# Patient Record
Sex: Male | Born: 1940 | Race: White | Hispanic: No | Marital: Married | Smoking: Former smoker
Health system: Southern US, Community
[De-identification: ages and names within clinical notes are randomized; demographics above are authoritative.]

## PROBLEM LIST (undated history)

## (undated) DIAGNOSIS — G459 Transient cerebral ischemic attack, unspecified: Secondary | ICD-10-CM

## (undated) DIAGNOSIS — H9319 Tinnitus, unspecified ear: Secondary | ICD-10-CM

## (undated) DIAGNOSIS — Z9889 Other specified postprocedural states: Secondary | ICD-10-CM

## (undated) DIAGNOSIS — R42 Dizziness and giddiness: Secondary | ICD-10-CM

## (undated) DIAGNOSIS — R112 Nausea with vomiting, unspecified: Secondary | ICD-10-CM

## (undated) HISTORY — DX: Dizziness and giddiness: R42

## (undated) HISTORY — DX: Transient cerebral ischemic attack, unspecified: G45.9

## (undated) HISTORY — DX: Tinnitus, unspecified ear: H93.19

---

## 1977-09-30 HISTORY — PX: KNEE SURGERY: SHX244

## 1997-09-30 HISTORY — PX: BACK SURGERY: SHX140

## 2001-06-18 ENCOUNTER — Encounter: Admission: RE | Admit: 2001-06-18 | Discharge: 2001-06-18 | Payer: Self-pay | Admitting: Family Medicine

## 2001-06-18 ENCOUNTER — Encounter: Payer: Self-pay | Admitting: Family Medicine

## 2003-05-27 ENCOUNTER — Encounter: Payer: Self-pay | Admitting: Family Medicine

## 2003-05-27 ENCOUNTER — Encounter: Admission: RE | Admit: 2003-05-27 | Discharge: 2003-05-27 | Payer: Self-pay | Admitting: Family Medicine

## 2004-11-30 ENCOUNTER — Ambulatory Visit: Payer: Self-pay | Admitting: Family Medicine

## 2004-12-10 ENCOUNTER — Ambulatory Visit: Payer: Self-pay | Admitting: Family Medicine

## 2004-12-19 ENCOUNTER — Ambulatory Visit: Payer: Self-pay | Admitting: Gastroenterology

## 2005-01-02 ENCOUNTER — Ambulatory Visit: Payer: Self-pay | Admitting: Gastroenterology

## 2006-01-13 ENCOUNTER — Ambulatory Visit: Payer: Self-pay | Admitting: Family Medicine

## 2006-02-04 ENCOUNTER — Ambulatory Visit: Payer: Self-pay

## 2007-04-16 DIAGNOSIS — J309 Allergic rhinitis, unspecified: Secondary | ICD-10-CM | POA: Insufficient documentation

## 2009-09-01 ENCOUNTER — Encounter: Admission: RE | Admit: 2009-09-01 | Discharge: 2009-09-01 | Payer: Self-pay | Admitting: Family Medicine

## 2009-09-30 HISTORY — PX: OTHER SURGICAL HISTORY: SHX169

## 2012-07-01 ENCOUNTER — Other Ambulatory Visit: Payer: Self-pay | Admitting: Urology

## 2012-07-22 ENCOUNTER — Encounter (HOSPITAL_COMMUNITY): Payer: Self-pay | Admitting: Pharmacy Technician

## 2012-07-27 NOTE — Patient Instructions (Signed)
Brandon Stein  07/27/2012   Your procedure is scheduled on:  07/31/12   Report to [facility] at 0900am-1100am  Call this number if you have problems the morning of surgery: [phone]   Remember:   Do not eat food:After Midnight.  May have clear liquids:until Midnight .    Take these medicines the morning of surgery with A SIP OF WATER:    Do not wear jewelry,   Do not wear lotions, powders, or perfume  . Men may shave face and neck.  Do not bring valuables to the hospital.  Contacts, dentures or bridgework may not be worn into surgery.  Leave suitcase in the car. After surgery it may be brought to your room.  For patients admitted to the hospital, checkout time is 11:00 AM the day of discharge.             SEE CHG INSTRUCTION SHEET    Please read over the following fact sheets that you were given: MRSA Information, Coughing and deep breathing exericses, leg exercises

## 2012-07-28 ENCOUNTER — Encounter (HOSPITAL_COMMUNITY): Payer: Self-pay

## 2012-07-28 ENCOUNTER — Encounter (HOSPITAL_COMMUNITY)
Admission: RE | Admit: 2012-07-28 | Discharge: 2012-07-28 | Disposition: A | Discharge status: Home / Self Care | Payer: Medicare HMO | Source: Ambulatory Visit | Attending: Urology | Admitting: Urology

## 2012-07-28 HISTORY — DX: Other specified postprocedural states: R11.2

## 2012-07-28 HISTORY — DX: Other specified postprocedural states: Z98.890

## 2012-07-28 LAB — BASIC METABOLIC PANEL
BUN: 15 mg/dL (ref 6–23)
CO2: 29 mEq/L (ref 19–32)
Calcium: 10.1 mg/dL (ref 8.4–10.5)
Chloride: 104 mEq/L (ref 96–112)
Creatinine, Ser: 0.96 mg/dL (ref 0.50–1.35)
Glucose, Bld: 55 mg/dL — ABNORMAL LOW (ref 70–99)

## 2012-07-28 LAB — CBC
HCT: 47.1 % (ref 39.0–52.0)
MCH: 30.1 pg (ref 26.0–34.0)
MCHC: 36.1 g/dL — ABNORMAL HIGH (ref 30.0–36.0)
MCV: 83.5 fL (ref 78.0–100.0)
Platelets: 200 10*3/uL (ref 150–400)
RDW: 13.6 % (ref 11.5–15.5)

## 2012-07-28 NOTE — Progress Notes (Signed)
Glucose on preop labs this am was 55. Patient voiced no complaints at preop appointment.  Left patient a message on cell phone to give nurse a call and verify that he remains okay.  Patient is not diabetic.

## 2012-07-30 MED ORDER — PROMETHAZINE HCL 25 MG/ML IJ SOLN
INTRAMUSCULAR | Status: AC
  Filled 2012-07-30: qty 1

## 2012-07-31 ENCOUNTER — Encounter (HOSPITAL_COMMUNITY): Payer: Self-pay | Admitting: *Deleted

## 2012-07-31 ENCOUNTER — Encounter (HOSPITAL_COMMUNITY): Payer: Self-pay | Admitting: Anesthesiology

## 2012-07-31 ENCOUNTER — Ambulatory Visit (HOSPITAL_COMMUNITY): Payer: Medicare HMO | Admitting: Anesthesiology

## 2012-07-31 ENCOUNTER — Observation Stay (HOSPITAL_COMMUNITY)
Admission: RE | Admit: 2012-07-31 | Discharge: 2012-08-01 | Disposition: A | Discharge status: Home / Self Care | Payer: Medicare HMO | Source: Ambulatory Visit | Attending: Urology | Admitting: Urology

## 2012-07-31 ENCOUNTER — Encounter (HOSPITAL_COMMUNITY)
Admission: RE | Disposition: A | Discharge status: Home / Self Care | Payer: Self-pay | Source: Ambulatory Visit | Attending: Urology

## 2012-07-31 DIAGNOSIS — N401 Enlarged prostate with lower urinary tract symptoms: Secondary | ICD-10-CM | POA: Insufficient documentation

## 2012-07-31 DIAGNOSIS — R39198 Other difficulties with micturition: Secondary | ICD-10-CM | POA: Insufficient documentation

## 2012-07-31 DIAGNOSIS — N138 Other obstructive and reflux uropathy: Principal | ICD-10-CM | POA: Insufficient documentation

## 2012-07-31 DIAGNOSIS — Z01812 Encounter for preprocedural laboratory examination: Secondary | ICD-10-CM | POA: Insufficient documentation

## 2012-07-31 DIAGNOSIS — N400 Enlarged prostate without lower urinary tract symptoms: Secondary | ICD-10-CM

## 2012-07-31 DIAGNOSIS — R339 Retention of urine, unspecified: Secondary | ICD-10-CM | POA: Insufficient documentation

## 2012-07-31 HISTORY — PX: TRANSURETHRAL RESECTION OF PROSTATE: SHX73

## 2012-07-31 SURGERY — TRANSURETHRAL RESECTION OF THE PROSTATE WITH GYRUS INSTRUMENTS
Anesthesia: General | Site: Bladder | Wound class: Clean Contaminated

## 2012-07-31 MED ORDER — MEPERIDINE HCL 50 MG/ML IJ SOLN: 6.2500 mg | INTRAMUSCULAR | Status: DC | PRN

## 2012-07-31 MED ORDER — BELLADONNA ALKALOIDS-OPIUM 16.2-60 MG RE SUPP
RECTAL | Status: AC
  Filled 2012-07-31: qty 1

## 2012-07-31 MED ORDER — BELLADONNA ALKALOIDS-OPIUM 16.2-60 MG RE SUPP
RECTAL | Status: DC | PRN
  Administered 2012-07-31: 1 via RECTAL

## 2012-07-31 MED ORDER — FENTANYL CITRATE 0.05 MG/ML IJ SOLN
INTRAMUSCULAR | Status: DC | PRN
  Administered 2012-07-31: 25 ug via INTRAVENOUS
  Administered 2012-07-31: 50 ug via INTRAVENOUS
  Administered 2012-07-31: 25 ug via INTRAVENOUS
  Administered 2012-07-31: 50 ug via INTRAVENOUS
  Administered 2012-07-31: 25 ug via INTRAVENOUS

## 2012-07-31 MED ORDER — HYOSCYAMINE SULFATE 0.125 MG SL SUBL
0.1250 mg | SUBLINGUAL_TABLET | SUBLINGUAL | Status: DC | PRN
  Filled 2012-07-31: qty 1

## 2012-07-31 MED ORDER — INDIGOTINDISULFONATE SODIUM 8 MG/ML IJ SOLN
INTRAMUSCULAR | Status: AC
  Filled 2012-07-31: qty 5

## 2012-07-31 MED ORDER — BACITRACIN-NEOMYCIN-POLYMYXIN 400-5-5000 EX OINT: 1.0000 "application " | TOPICAL_OINTMENT | Freq: Three times a day (TID) | CUTANEOUS | Status: DC | PRN

## 2012-07-31 MED ORDER — LACTATED RINGERS IV SOLN
INTRAVENOUS | Status: DC
  Administered 2012-08-01: 05:00:00 via INTRAVENOUS

## 2012-07-31 MED ORDER — PROPOFOL 10 MG/ML IV BOLUS
INTRAVENOUS | Status: DC | PRN
  Administered 2012-07-31: 200 mg via INTRAVENOUS

## 2012-07-31 MED ORDER — CEFAZOLIN SODIUM 1-5 GM-% IV SOLN: 1.0000 g | INTRAVENOUS | Status: DC

## 2012-07-31 MED ORDER — MORPHINE SULFATE 2 MG/ML IJ SOLN: 2.0000 mg | INTRAMUSCULAR | Status: DC | PRN

## 2012-07-31 MED ORDER — ACETAMINOPHEN 325 MG PO TABS: 650.0000 mg | ORAL_TABLET | ORAL | Status: DC | PRN

## 2012-07-31 MED ORDER — ACETAMINOPHEN 10 MG/ML IV SOLN
INTRAVENOUS | Status: DC | PRN
  Administered 2012-07-31: 1000 mg via INTRAVENOUS

## 2012-07-31 MED ORDER — PROMETHAZINE HCL 25 MG/ML IJ SOLN
INTRAMUSCULAR | Status: AC
  Filled 2012-07-31: qty 1

## 2012-07-31 MED ORDER — SULFAMETHOXAZOLE-TMP DS 800-160 MG PO TABS
1.0000 | ORAL_TABLET | Freq: Two times a day (BID) | ORAL | Status: DC
  Administered 2012-08-01: 1 via ORAL
  Filled 2012-07-31 (×2): qty 1

## 2012-07-31 MED ORDER — LEVOFLOXACIN IN D5W 500 MG/100ML IV SOLN
500.0000 mg | INTRAVENOUS | Status: AC
  Administered 2012-07-31: 500 mg via INTRAVENOUS
  Filled 2012-07-31: qty 100

## 2012-07-31 MED ORDER — EPHEDRINE SULFATE 50 MG/ML IJ SOLN
INTRAMUSCULAR | Status: DC | PRN
  Administered 2012-07-31: 5 mg via INTRAVENOUS

## 2012-07-31 MED ORDER — DOCUSATE SODIUM 100 MG PO CAPS: 100.0000 mg | ORAL_CAPSULE | Freq: Two times a day (BID) | ORAL | Status: DC

## 2012-07-31 MED ORDER — DEXAMETHASONE SODIUM PHOSPHATE 10 MG/ML IJ SOLN
INTRAMUSCULAR | Status: DC | PRN
  Administered 2012-07-31: 10 mg via INTRAVENOUS

## 2012-07-31 MED ORDER — OXYCODONE-ACETAMINOPHEN 5-325 MG PO TABS: 1.0000 | ORAL_TABLET | ORAL | Status: DC | PRN

## 2012-07-31 MED ORDER — TAMSULOSIN HCL 0.4 MG PO CAPS: 0.4000 mg | ORAL_CAPSULE | Freq: Every day | ORAL | Status: DC

## 2012-07-31 MED ORDER — SODIUM CHLORIDE 0.9 % IR SOLN
Status: DC | PRN
  Administered 2012-07-31: 30000 mL via INTRAVESICAL

## 2012-07-31 MED ORDER — FENTANYL CITRATE 0.05 MG/ML IJ SOLN: 25.0000 ug | INTRAMUSCULAR | Status: DC | PRN

## 2012-07-31 MED ORDER — LIDOCAINE HCL 2 % EX GEL
CUTANEOUS | Status: AC
  Filled 2012-07-31: qty 10

## 2012-07-31 MED ORDER — ACETAMINOPHEN 10 MG/ML IV SOLN
INTRAVENOUS | Status: AC
  Filled 2012-07-31: qty 100

## 2012-07-31 MED ORDER — LACTATED RINGERS IV SOLN: INTRAVENOUS | Status: DC

## 2012-07-31 MED ORDER — SULFAMETHOXAZOLE-TRIMETHOPRIM 800-160 MG PO TABS: 1.0000 | ORAL_TABLET | Freq: Two times a day (BID) | ORAL | Status: DC

## 2012-07-31 MED ORDER — ONDANSETRON HCL 4 MG/2ML IJ SOLN: 4.0000 mg | INTRAMUSCULAR | Status: DC | PRN

## 2012-07-31 MED ORDER — ZOLPIDEM TARTRATE 5 MG PO TABS: 5.0000 mg | ORAL_TABLET | Freq: Every evening | ORAL | Status: DC | PRN

## 2012-07-31 MED ORDER — SODIUM CHLORIDE 0.9 % IR SOLN
3000.0000 mL | Status: DC
  Administered 2012-07-31: 3000 mL

## 2012-07-31 MED ORDER — ONDANSETRON HCL 4 MG/2ML IJ SOLN
INTRAMUSCULAR | Status: DC | PRN
  Administered 2012-07-31: 4 mg via INTRAVENOUS

## 2012-07-31 MED ORDER — LIDOCAINE HCL (CARDIAC) 20 MG/ML IV SOLN
INTRAVENOUS | Status: DC | PRN
  Administered 2012-07-31: 50 mg via INTRAVENOUS

## 2012-07-31 MED ORDER — LACTATED RINGERS IV SOLN
INTRAVENOUS | Status: DC | PRN
  Administered 2012-07-31 (×2): via INTRAVENOUS

## 2012-07-31 MED ORDER — PROMETHAZINE HCL 25 MG/ML IJ SOLN
6.2500 mg | INTRAMUSCULAR | Status: DC | PRN
  Administered 2012-07-31: 6.25 mg via INTRAVENOUS

## 2012-07-31 MED ORDER — DOCUSATE SODIUM 100 MG PO CAPS
100.0000 mg | ORAL_CAPSULE | Freq: Two times a day (BID) | ORAL | Status: DC
  Administered 2012-07-31 – 2012-08-01 (×2): 100 mg via ORAL
  Filled 2012-07-31 (×3): qty 1

## 2012-07-31 MED ORDER — MIDAZOLAM HCL 5 MG/5ML IJ SOLN
INTRAMUSCULAR | Status: DC | PRN
  Administered 2012-07-31: 2 mg via INTRAVENOUS

## 2012-07-31 SURGICAL SUPPLY — 17 items
BAG URINE DRAINAGE (UROLOGICAL SUPPLIES) ×2 IMPLANT
BAG URO CATCHER STRL LF (DRAPE) ×2 IMPLANT
CATH FOLEY 3WAY 30CC 22FR (CATHETERS) ×2 IMPLANT
CLOTH BEACON ORANGE TIMEOUT ST (SAFETY) ×2 IMPLANT
DRAPE CAMERA CLOSED 9X96 (DRAPES) ×2 IMPLANT
ELECT BUTTON HF 24-28F 2 30DE (ELECTRODE) ×2 IMPLANT
ELECT LOOP MED HF 24F 12D (CUTTING LOOP) ×1 IMPLANT
ELECT LOOP MED HF 24F 12D CBL (CLIP) ×2 IMPLANT
ELECT RESECT VAPORIZE 12D CBL (ELECTRODE) IMPLANT
EVACUATOR MICROVAS BLADDER (UROLOGICAL SUPPLIES) ×2 IMPLANT
GLOVE BIOGEL M STRL SZ7.5 (GLOVE) ×2 IMPLANT
GOWN STRL REIN XL XLG (GOWN DISPOSABLE) ×2 IMPLANT
HOLDER FOLEY CATH W/STRAP (MISCELLANEOUS) ×1 IMPLANT
MANIFOLD NEPTUNE II (INSTRUMENTS) ×2 IMPLANT
PACK CYSTO (CUSTOM PROCEDURE TRAY) ×2 IMPLANT
SYR 30ML LL (SYRINGE) ×1 IMPLANT
TUBING CONNECTING 10 (TUBING) ×2 IMPLANT

## 2012-07-31 NOTE — H&P (Signed)
H&P  History of Present Illness:  71 yo with long history of BPH, weak stream, prostatitis and incomplete bladder emptying. He does not want to take medication for BPH and has tried alpha blockers and 5alphaRI in the past but did not like the side effects and stopped them. He presents today for TURP.    Past Medical History  Diagnosis Date  . PONV (postoperative nausea and vomiting)    Past Surgical History  Procedure Date  . Right shoulder surgery    . Back surgery     L4-5   . Knee surgery     bilateral     Home Medications:  No prescriptions prior to admission   Allergies:  Allergies  Allergen Reactions  . Cefaclor     REACTION: Hives    History reviewed. No pertinent family history. Social History:  reports that he has never smoked. He has never used smokeless tobacco. He reports that he drinks alcohol. He reports that he does not use illicit drugs.  ROS: A complete review of systems was performed.  All systems are negative except for pertinent findings as noted. @ROS @   Physical Exam:  Vital signs in last 24 hours: Temp:  [98.5 F (36.9 C)] 98.5 F (36.9 C) (11/01 0714) Pulse Rate:  [60] 60  (11/01 0714) Resp:  [18] 18  (11/01 0714) BP: (130)/(79) 130/79 mmHg (11/01 0714) SpO2:  [96 %] 96 % (11/01 0714) General:  Alert and oriented, No acute distress HEENT: Normocephalic, atraumatic Cardiovascular: Regular rate and rhythm Lungs: Regular rate and effort Abdomen: Soft, nontender, nondistended, no abdominal masses Back: No CVA tenderness Extremities: No edema Neurologic: Grossly intact  Laboratory Data:  No results found for this or any previous visit (from the past 24 hour(s)). Recent Results (from the past 240 hour(s))  SURGICAL PCR SCREEN     Status: Normal   Collection Time   07/28/12  9:25 AM      Component Value Range Status Comment   MRSA, PCR NEGATIVE  NEGATIVE Final    Staphylococcus aureus NEGATIVE  NEGATIVE Final    Creatinine:  Basename  07/28/12 0825  CREATININE 0.96    Impression/Assessment:  BPH, incomplete bladder emptying  Plan:  I discussed with the patient the nature, potential benefits, risks and alternatives to bipolar TURP, including side effects of the proposed treatment, the likelihood of the patient achieving the goals of the procedure, and any potential problems that might occur during the procedure or recuperation. All questions answered. Patient elects to proceed.   Antony Haste 07/31/2012, 8:58 AM

## 2012-07-31 NOTE — Preoperative (Signed)
Beta Blockers   Reason not to administer Beta Blockers:Not Applicable 

## 2012-07-31 NOTE — Transfer of Care (Signed)
Immediate Anesthesia Transfer of Care Note  Patient: Brandon Stein  Procedure(s) Performed: Procedure(s) (LRB) with comments: TRANSURETHRAL RESECTION OF THE PROSTATE WITH GYRUS INSTRUMENTS (N/A)  Patient Location: PACU  Anesthesia Type:General  Level of Consciousness: awake, alert  and oriented  Airway & Oxygen Therapy: Patient Spontanous Breathing and Patient connected to face mask oxygen  Post-op Assessment: Report given to PACU RN, Post -op Vital signs reviewed and stable and Patient moving all extremities X 4  Post vital signs: Reviewed and stable  Complications: No apparent anesthesia complications

## 2012-07-31 NOTE — Op Note (Signed)
Preop diagnosis: BPH, incomplete bladder emptying, weak stream Postoperative diagnosis same.  Procedure transurethral resection of prostate  Surgeon Eschol Auxier  Anesthesia Gen.  Findings: Cystoscopy-the urethra was normal prostatic urethra was obstructed by a bilobed large median lobe protruding into the bladder. There were also short obstructing lateral lobes. The trigone and ureteral orifice these appeared normal and in their orthotopic position. They were examined pre-and post resection noted to be normal. The bladder was moderately trabeculated. There was no foreign body lesion or tumor.  Description of procedure: After consent was obtained patient brought to the operating room. A timeout was performed to confirm the patient and procedure. After adequate anesthesia he was placed in lithotomy position and prepped and draped in usual fashion. The cystoscope was passed per urethra and the bladder examined. The resectoscope sheath was then passed followed by insertion of the resectoscope handle. Using the loop the median lobe was taken down and natural separation in the midline down to the bladder neck and down to the area. Then toward the patient's left side another incision was made into the bladder neck down to the capsule extending a standard inferior to separate the left side of a large median lobe. The intervening median lobe was then resected down from bladder neck to veru. The prostatic fossa remained obstructed by the remainder the median lobe going over toward the patient's right side another incision was made down through the adenoma down to the bladder neck and this was carried down to the veru developing the remaining adenoma the median lobe. In a similar fashion this part of the lobe was resected down to the bladder neck in that area completing the median lobe resection. Now what appeared to be short and nonobstructing lateral lobes collapsed inward and instructed the prostatic urethra they  were resected from anterior down the posterior and connecting these to the median lobe resection started on the left side and then the right side. No resection was carried distal to the veru. All the chips were evacuated with a Microvasive in the button was placed. Some residual tissue laterally and medially was vaporized and adequate hemostasis was obtained. The ureteral orifice these were inspected again and noted to be normal also no chips were seen visually in the bladder. Irrigated with a Microvasive one more time and no chips were seen. The scope was removed and a 22 [place] three-way Foley catheter was placed with catheter guide. The balloon was inflated to 30 cc and seated at the bladder neck. The patient was awakened and taken to recovery in stable condition.  Estimated blood loss: Minimal  Specimen: TURP chips  Drains: 22 French three-way Foley  Complications: None  Disposition: Patient stable to PACU

## 2012-07-31 NOTE — Anesthesia Postprocedure Evaluation (Signed)
  Anesthesia Post-op Note  Patient: Brandon Stein  Procedure(s) Performed: Procedure(s) (LRB): TRANSURETHRAL RESECTION OF THE PROSTATE WITH GYRUS INSTRUMENTS (N/A)  Patient Location: PACU  Anesthesia Type: General  Level of Consciousness: awake and alert   Airway and Oxygen Therapy: Patient Spontanous Breathing  Post-op Pain: mild  Post-op Assessment: Post-op Vital signs reviewed, Patient's Cardiovascular Status Stable, Respiratory Function Stable, Patent Airway and No signs of Nausea or vomiting  Post-op Vital Signs: stable  Complications: No apparent anesthesia complications

## 2012-07-31 NOTE — Anesthesia Preprocedure Evaluation (Addendum)
Anesthesia Evaluation  Patient identified by MRN, date of birth, ID band Patient awake    Reviewed: Allergy & Precautions, H&P , NPO status , Patient's Chart, lab work & pertinent test results  History of Anesthesia Complications (+) PONV  Airway Mallampati: II TM Distance: >3 FB Neck ROM: Full    Dental No notable dental hx.    Pulmonary neg pulmonary ROS,  breath sounds clear to auscultation  Pulmonary exam normal       Cardiovascular negative cardio ROS  Rhythm:Regular Rate:Normal     Neuro/Psych negative neurological ROS  negative psych ROS   GI/Hepatic negative GI ROS, Neg liver ROS,   Endo/Other  negative endocrine ROS  Renal/GU negative Renal ROS  negative genitourinary   Musculoskeletal negative musculoskeletal ROS (+)   Abdominal   Peds negative pediatric ROS (+)  Hematology negative hematology ROS (+)   Anesthesia Other Findings   Reproductive/Obstetrics negative OB ROS                           Anesthesia Physical Anesthesia Plan  ASA: II  Anesthesia Plan: General   Post-op Pain Management:    Induction: Intravenous  Airway Management Planned: LMA and Oral ETT  Additional Equipment:   Intra-op Plan:   Post-operative Plan: Extubation in OR  Informed Consent: I have reviewed the patients History and Physical, chart, labs and discussed the procedure including the risks, benefits and alternatives for the proposed anesthesia with the patient or authorized representative who has indicated his/her understanding and acceptance.   Dental advisory given  Plan Discussed with: CRNA  Anesthesia Plan Comments:         Anesthesia Quick Evaluation

## 2012-08-01 NOTE — Progress Notes (Signed)
Urology Progress Note  Subjective:     No acute urologic events overnight. Foley remove this morning; voiding without difficulty.   ROS: Negative: Chest pain, SOB.  Objective:  Patient Vitals for the past 24 hrs:  BP Temp Temp src Pulse Resp SpO2 Height Weight  08/01/12 0521 127/72 mmHg 97.4 F (36.3 C) Oral 60  18  97 % - -  08/01/12 0206 120/62 mmHg 97.5 F (36.4 C) Oral 70  18  98 % - -  07/31/12 1956 127/72 mmHg 98 F (36.7 C) Oral 63  18  97 % - -  07/31/12 1728 137/80 mmHg 97.4 F (36.3 C) Oral 59  18  97 % - -  07/31/12 1534 127/74 mmHg 97.7 F (36.5 C) Oral 85  18  100 % - -  07/31/12 1216 - - - - - - 5\' 9"  (1.753 m) 83.12 kg (183 lb 3.9 oz)  07/31/12 1200 142/82 mmHg 97.5 F (36.4 C) - 53  - 100 % - -  07/31/12 1145 132/72 mmHg - - 47  12  100 % - -  07/31/12 1130 134/65 mmHg 97.5 F (36.4 C) - 53  19  100 % - -  07/31/12 1115 135/76 mmHg - - 55  16  100 % - -  07/31/12 1100 134/92 mmHg 97.5 F (36.4 C) - 62  12  100 % - -    Physical Exam: General:  No acute distress, awake Cardiovascular:    [x]   S1/S2 present, RRR  []   Irregularly irregular Chest:  CTA-B Abdomen:               []  Soft, appropriately TTP  [x]  Soft, NTTP  []  Soft, appropriately TTP, incision(s) clean/dry/intact  Genitourinary: No edema. Foley:  None. 1st voided specimen is cherry koolaid colored    I/O last 3 completed shifts: In: 14048.8 [P.O.:980; I.V.:2768.8; Other:10300] Out: 65784 [ONGEX:52841; Blood:100]  No results found for this basename: HGB:2,WBC:2,PLT:2 in the last 72 hours  No results found for this basename: NA:2,K:2,CL:2,CO2:2,BUN:2,CREATININE:2,CALCIUM:2,MAGNESIUM:2,GFRNONAA:2,GFRAA:2 in the last 72 hours   No results found for this basename: PT:2,INR:2,APTT:2 in the last 72 hours   No components found with this basename: ABG:2    Length of stay: 1 days.  Assessment: BPH.  POD#1 TURP   Plan: -Complete voiding trial. -Discharge home this morning.   Natalia Leatherwood, MD [phone]

## 2012-08-01 NOTE — Progress Notes (Signed)
Patient discharge to home, wife at bedside, 3 strings urine done light pink , voiding without difficulty. PIV removed no s/s of infiltration noted. Discharge instructions and follow up appointment done and given to the patient. No complaints of any pain or discomfort upon discharge.

## 2012-08-01 NOTE — Discharge Summary (Signed)
Physician Discharge Summary  Patient ID: Brandon Stein MRN: [id number] DOB/AGE: [birth year] 71 y.o.  Admit date: 07/31/2012 Discharge date: 08/01/2012  Admission Diagnoses: Benign prostate hypertrophy  Discharge Diagnoses:  Benign prostate hypertrophy  Discharged Condition: good  Hospital Course:  Patient admitted following TURP for BPH. He did well overnight on continuous bladder irrigation. His catheter was removed the following morning and he was able to void adequately. He tolerated a regular diet and controlled his pain with oral medications.  Consults: None  Significant Diagnostic Studies: None.  Treatments: surgery: Transurethral resection of the prostate.  Discharge Exam: Blood pressure 127/72, pulse 60, temperature 97.4 F (36.3 C), temperature source Oral, resp. rate 18, height 5\' 9"  (1.753 m), weight 83.12 kg (183 lb 3.9 oz), SpO2 97.00%. See PE from progress note on date of discharge.  Disposition: Home- self care.  Discharge Orders    Future Orders Please Complete By Expires   Discharge patient          Medication List     As of 08/01/2012  9:25 AM    TAKE these medications         docusate sodium 100 MG capsule   Commonly known as: COLACE   Take 1 capsule (100 mg total) by mouth 2 (two) times daily.      oxyCODONE-acetaminophen 5-325 MG per tablet   Commonly known as: PERCOCET/ROXICET   Take 1 tablet by mouth every 4 (four) hours as needed for pain.      sulfamethoxazole-trimethoprim 800-160 MG per tablet   Commonly known as: BACTRIM DS,SEPTRA DS   Take 1 tablet by mouth 2 (two) times daily.      Tamsulosin HCl 0.4 MG Caps   Commonly known as: FLOMAX   Take 1 capsule (0.4 mg total) by mouth daily after supper.           Follow-up Information    Follow up with [facility], NP. In 2 weeks.   Contact information:   [address] [facility] [place] [address] [phone]          Signed: Milford Cage 08/01/2012, 9:25 AM

## 2012-08-03 ENCOUNTER — Encounter (HOSPITAL_COMMUNITY): Payer: Self-pay | Admitting: Urology

## 2013-03-03 ENCOUNTER — Other Ambulatory Visit: Payer: Self-pay | Admitting: Family Medicine

## 2013-03-03 ENCOUNTER — Ambulatory Visit
Admission: RE | Admit: 2013-03-03 | Discharge: 2013-03-03 | Disposition: A | Discharge status: Home / Self Care | Payer: Medicare HMO | Source: Ambulatory Visit | Attending: Family Medicine | Admitting: Family Medicine

## 2013-03-03 DIAGNOSIS — M549 Dorsalgia, unspecified: Secondary | ICD-10-CM

## 2013-03-03 DIAGNOSIS — R079 Chest pain, unspecified: Secondary | ICD-10-CM

## 2014-07-08 ENCOUNTER — Encounter: Payer: Self-pay | Admitting: *Deleted

## 2014-12-07 ENCOUNTER — Encounter: Payer: Self-pay | Admitting: Gastroenterology

## 2014-12-28 ENCOUNTER — Emergency Department (HOSPITAL_COMMUNITY)
Admission: EM | Admit: 2014-12-28 | Discharge: 2014-12-28 | Disposition: A | Discharge status: Other Acute Inpatient Hospital | Payer: Medicare Other | Source: Home / Self Care

## 2014-12-28 ENCOUNTER — Encounter (HOSPITAL_COMMUNITY): Payer: Self-pay

## 2014-12-28 ENCOUNTER — Observation Stay (HOSPITAL_COMMUNITY)
Admission: EM | Admit: 2014-12-28 | Discharge: 2014-12-30 | Disposition: A | Discharge status: Home / Self Care | Payer: Medicare Other | Attending: Internal Medicine | Admitting: Internal Medicine

## 2014-12-28 ENCOUNTER — Observation Stay (HOSPITAL_COMMUNITY): Payer: Medicare Other

## 2014-12-28 ENCOUNTER — Emergency Department (HOSPITAL_COMMUNITY): Payer: Medicare Other

## 2014-12-28 DIAGNOSIS — Z7982 Long term (current) use of aspirin: Secondary | ICD-10-CM | POA: Insufficient documentation

## 2014-12-28 DIAGNOSIS — R51 Headache: Secondary | ICD-10-CM | POA: Insufficient documentation

## 2014-12-28 DIAGNOSIS — R4701 Aphasia: Secondary | ICD-10-CM | POA: Insufficient documentation

## 2014-12-28 DIAGNOSIS — Z888 Allergy status to other drugs, medicaments and biological substances status: Secondary | ICD-10-CM | POA: Insufficient documentation

## 2014-12-28 DIAGNOSIS — N411 Chronic prostatitis: Secondary | ICD-10-CM | POA: Diagnosis present

## 2014-12-28 DIAGNOSIS — B351 Tinea unguium: Secondary | ICD-10-CM | POA: Insufficient documentation

## 2014-12-28 DIAGNOSIS — G459 Transient cerebral ischemic attack, unspecified: Secondary | ICD-10-CM | POA: Diagnosis not present

## 2014-12-28 DIAGNOSIS — G451 Carotid artery syndrome (hemispheric): Secondary | ICD-10-CM | POA: Diagnosis not present

## 2014-12-28 LAB — CBC
HCT: 45.1 % (ref 39.0–52.0)
HEMOGLOBIN: 16 g/dL (ref 13.0–17.0)
MCH: 30.2 pg (ref 26.0–34.0)
MCHC: 35.5 g/dL (ref 30.0–36.0)
MCV: 85.1 fL (ref 78.0–100.0)
PLATELETS: 185 10*3/uL (ref 150–400)
RBC: 5.3 MIL/uL (ref 4.22–5.81)
RDW: 13.8 % (ref 11.5–15.5)
WBC: 5.3 10*3/uL (ref 4.0–10.5)

## 2014-12-28 LAB — DIFFERENTIAL
BASOS ABS: 0 10*3/uL (ref 0.0–0.1)
BASOS PCT: 1 % (ref 0–1)
Eosinophils Absolute: 0.1 10*3/uL (ref 0.0–0.7)
Eosinophils Relative: 2 % (ref 0–5)
LYMPHS ABS: 1.3 10*3/uL (ref 0.7–4.0)
Lymphocytes Relative: 24 % (ref 12–46)
Monocytes Absolute: 0.5 10*3/uL (ref 0.1–1.0)
Monocytes Relative: 10 % (ref 3–12)
NEUTROS PCT: 63 % (ref 43–77)
Neutro Abs: 3.4 10*3/uL (ref 1.7–7.7)

## 2014-12-28 LAB — I-STAT CHEM 8, ED
BUN: 19 mg/dL (ref 6–23)
BUN: 19 mg/dL (ref 6–23)
Calcium, Ion: 1.26 mmol/L (ref 1.13–1.30)
Calcium, Ion: 1.23 mmol/L (ref 1.13–1.30)
Chloride: 104 mmol/L (ref 96–112)
Chloride: 104 mmol/L (ref 96–112)
Creatinine, Ser: 1.1 mg/dL (ref 0.50–1.35)
Creatinine, Ser: 1.2 mg/dL (ref 0.50–1.35)
GLUCOSE: 67 mg/dL — AB (ref 70–99)
Glucose, Bld: 64 mg/dL — ABNORMAL LOW (ref 70–99)
HCT: 47 % (ref 39.0–52.0)
HCT: 48 % (ref 39.0–52.0)
HEMOGLOBIN: 16 g/dL (ref 13.0–17.0)
Hemoglobin: 16.3 g/dL (ref 13.0–17.0)
POTASSIUM: 4.1 mmol/L (ref 3.5–5.1)
POTASSIUM: 4.2 mmol/L (ref 3.5–5.1)
SODIUM: 141 mmol/L (ref 135–145)
Sodium: 140 mmol/L (ref 135–145)
TCO2: 24 mmol/L (ref 0–100)
TCO2: 24 mmol/L (ref 0–100)

## 2014-12-28 LAB — I-STAT TROPONIN, ED: TROPONIN I, POC: 0 ng/mL (ref 0.00–0.08)

## 2014-12-28 LAB — COMPREHENSIVE METABOLIC PANEL
ALT: 20 U/L (ref 0–53)
AST: 25 U/L (ref 0–37)
Albumin: 4.2 g/dL (ref 3.5–5.2)
Alkaline Phosphatase: 72 U/L (ref 39–117)
Anion gap: 10 (ref 5–15)
BILIRUBIN TOTAL: 0.9 mg/dL (ref 0.3–1.2)
BUN: 14 mg/dL (ref 6–23)
CHLORIDE: 105 mmol/L (ref 96–112)
CO2: 25 mmol/L (ref 19–32)
Calcium: 9.8 mg/dL (ref 8.4–10.5)
Creatinine, Ser: 1.2 mg/dL (ref 0.50–1.35)
GFR calc non Af Amer: 59 mL/min — ABNORMAL LOW (ref >90)
GFR, EST AFRICAN AMERICAN: 68 mL/min — AB (ref >90)
GLUCOSE: 73 mg/dL (ref 70–99)
POTASSIUM: 4.2 mmol/L (ref 3.5–5.1)
Sodium: 140 mmol/L (ref 135–145)
TOTAL PROTEIN: 6.6 g/dL (ref 6.0–8.3)

## 2014-12-28 LAB — GLUCOSE, CAPILLARY: Glucose-Capillary: 81 mg/dL (ref 70–99)

## 2014-12-28 LAB — CBG MONITORING, ED: Glucose-Capillary: 79 mg/dL (ref 70–99)

## 2014-12-28 LAB — PROTIME-INR
INR: 1.11 (ref 0.00–1.49)
PROTHROMBIN TIME: 14.4 s (ref 11.6–15.2)

## 2014-12-28 LAB — APTT: APTT: 32 s (ref 24–37)

## 2014-12-28 MED ORDER — IOHEXOL 350 MG/ML SOLN
80.0000 mL | Freq: Once | INTRAVENOUS | Status: AC | PRN
  Administered 2014-12-28: 80 mL via INTRAVENOUS

## 2014-12-28 MED ORDER — STROKE: EARLY STAGES OF RECOVERY BOOK
Freq: Once | Status: AC
  Administered 2014-12-28: 1

## 2014-12-28 MED ORDER — ENOXAPARIN SODIUM 40 MG/0.4ML ~~LOC~~ SOLN
40.0000 mg | SUBCUTANEOUS | Status: DC
  Administered 2014-12-28 – 2014-12-29 (×2): 40 mg via SUBCUTANEOUS
  Filled 2014-12-28 (×2): qty 0.4

## 2014-12-28 MED ORDER — SODIUM CHLORIDE 0.9 % IV SOLN
Freq: Once | INTRAVENOUS | Status: AC
  Administered 2014-12-28: 16:00:00 via INTRAVENOUS

## 2014-12-28 MED ORDER — ASPIRIN 325 MG PO TABS
325.0000 mg | ORAL_TABLET | Freq: Every day | ORAL | Status: DC
  Administered 2014-12-28 – 2014-12-30 (×3): 325 mg via ORAL
  Filled 2014-12-28 (×3): qty 1

## 2014-12-28 NOTE — H&P (Signed)
[facility] History and Physical  Patient: Brandon Stein  MRN: [id number]  DOB: [birth year]  DOS: the patient was seen and examined on 12/28/2014 PCP: [name], MD  Chief Complaint: Slurred speech  HPI: Brandon Stein is a 74 y.o. male with Past medical history of chronic prostatitis. The patient is presenting with complaints of slurred speech. He mentions that today when he was signing some papers he noticed that he had difficulty getting his words out. Later on he also started having difficulty speaking the right words. His symptom lasted for a few hours. Therefore he was taken to the urgent care immediately. He also complains of some decreased sensation on the right side of the face. There was no headache initially no blurring of the vision unknown choking episode no chest pain, dizziness, vertigo, nausea, vomiting, abdominal pain, shortness of breath, cough, recent congestion, burning urination, tingling or numbness or any other focal deficit. There was no fall trauma or head injury. He did have 2 loose bowel motions earlier in the morning. He has chronic prostatitis and has been taking Cipro for last 3 weeks and he is still supposed to take it for 2 more weeks. He has chronic toenail infection and has been taking fluconazole once a week for last 1 year. He does not take any other medication and he is fairly active at his baseline.  The patient is coming from home. And at his baseline independent for most of his ADL.  Review of Systems: as mentioned in the history of present illness.  A Comprehensive review of the other systems is negative.  Past Medical History  Diagnosis Date  . PONV (postoperative nausea and vomiting)    Past Surgical History  Procedure Laterality Date  . Right shoulder surgery     . Back surgery      L4-5   . Knee surgery      bilateral   . Transurethral resection of prostate  07/31/2012    Procedure: TRANSURETHRAL RESECTION OF THE  PROSTATE WITH GYRUS INSTRUMENTS;  Surgeon: Antony Haste, MD;  Location: [facility];  Service: Urology;  Laterality: N/A;   Social History:  reports that he has quit smoking. He has never used smokeless tobacco. He reports that he drinks alcohol. He reports that he does not use illicit drugs.  Allergies  Allergen Reactions  . Cefaclor     REACTION: Hives    Family History  Problem Relation Age of Onset  . Kidney disease Mother   . Diabetes Mother   . Coronary artery disease Mother   . Hypertension Mother   . COPD Father     Prior to Admission medications   Medication Sig Start Date End Date Taking? Authorizing Provider  ciprofloxacin (CIPRO) 500 MG tablet Take 500 mg by mouth 2 (two) times daily.   Yes Historical Provider, MD  fluconazole (DIFLUCAN) 200 MG tablet Take 200 mg by mouth once a week.  11/24/14  Yes Historical Provider, MD  ibuprofen (ADVIL,MOTRIN) 200 MG tablet Take 800 mg by mouth every 6 (six) hours as needed for headache.   Yes Historical Provider, MD    Physical Exam: Filed Vitals:   12/28/14 1927 12/28/14 1930 12/28/14 1945 12/28/14 2000  BP: 170/83 154/84 135/87 147/86  Pulse:  57 50 55  Temp: 98.2 F (36.8 C)     TempSrc: Oral     Resp: 16 17 16 19   Weight:      SpO2: 100% 99% 99% 100%  General: Alert, Awake and Oriented to Time, Place and Person. Appear in mild distress Eyes: PERRL ENT: Oral Mucosa clear moist. Neck: no JVD Cardiovascular: S1 and S2 Present, no Murmur, Peripheral Pulses Present Respiratory: Bilateral Air entry equal and Decreased, Clear to Auscultation, noCrackles, no wheezes Abdomen: Bowel Sound present, Soft and non tender Skin: no Rash Extremities: no Pedal edema, no calf tenderness Neurologic: Grossly no focal neuro deficit.  Labs on Admission:  CBC:  Recent Labs Lab 12/28/14 1636 12/28/14 1704 12/28/14 1739  WBC 5.3  --   --   NEUTROABS 3.4  --   --   HGB 16.0 16.0 16.3  HCT 45.1 47.0 48.0  MCV 85.1  --    --   PLT 185  --   --     CMP     Component Value Date/Time   NA 141 12/28/2014 1739   K 4.2 12/28/2014 1739   CL 104 12/28/2014 1739   CO2 25 12/28/2014 1636   GLUCOSE 64* 12/28/2014 1739   BUN 19 12/28/2014 1739   CREATININE 1.20 12/28/2014 1739   CALCIUM 9.8 12/28/2014 1636   PROT 6.6 12/28/2014 1636   ALBUMIN 4.2 12/28/2014 1636   AST 25 12/28/2014 1636   ALT 20 12/28/2014 1636   ALKPHOS 72 12/28/2014 1636   BILITOT 0.9 12/28/2014 1636   GFRNONAA 59* 12/28/2014 1636   GFRAA 68* 12/28/2014 1636    No results for input(s): LIPASE, AMYLASE in the last 168 hours.  No results for input(s): CKTOTAL, CKMB, CKMBINDEX, TROPONINI in the last 168 hours. BNP (last 3 results) No results for input(s): BNP in the last 8760 hours.  ProBNP (last 3 results) No results for input(s): PROBNP in the last 8760 hours.   Radiological Exams on Admission: Ct Angio Head W/cm &/or Wo Cm  12/28/2014   CLINICAL DATA:  Expressive aphasia with headache beginning earlier today. Minimal neurologic symptoms currently. Initial encounter.  EXAM: CT ANGIOGRAPHY HEAD rib  TECHNIQUE: Multidetector CT imaging of the head was performed using the standard protocol during bolus administration of intravenous contrast. Multiplanar CT image reconstructions and MIPs were obtained to evaluate the vascular anatomy.  CONTRAST:  80mL OMNIPAQUE IOHEXOL 350 MG/ML SOLN  COMPARISON:  Noncontrast CT head earlier today.  FINDINGS: CT HEAD  Calvarium and skull base: No fracture or destructive lesion. Mastoids and middle ears are grossly clear.  Paranasal sinuses: Imaged portions are clear.  Orbits: Negative.  Brain: No evidence of acute abnormality, including acute infarct, hemorrhage, hydrocephalus, or mass lesion.  CTA NECK  Aortic arch: Standard branching. Imaged portion shows no evidence of aneurysm or dissection. No significant stenosis of the major arch vessel origins.  Right carotid system: No evidence of dissection, stenosis  (50% or greater) or occlusion. In frontal  Left carotid system: No evidence of dissection, stenosis (50% or greater) or occlusion.  Vertebral arteries: RIGHT vertebral dominant. No evidence of dissection, stenosis (50% or greater) or occlusion.  Nonvascular soft tissues: Cervical spondylosis. No lung apex lesion. No neck masses.  CTA HEAD  Anterior circulation: No significant stenosis, proximal occlusion, aneurysm, or vascular malformation.  Posterior circulation: No significant stenosis, proximal occlusion, aneurysm, or vascular malformation.  Venous sinuses: As permitted by contrast timing, patent.  Anatomic variants: None of significance.  Delayed phase:   No abnormal intracranial enhancement.  IMPRESSION: No extracranial or intracranial flow reducing lesion or dissection is observed. Specifically, no evidence for dissection.   Electronically Signed   By: Unice Bailey.D.  On: 12/28/2014 18:05   Ct Head (brain) Wo Contrast  12/28/2014   CLINICAL DATA:  Code stroke. Headache. Double vision. Difficulty speaking at times.  EXAM: CT HEAD WITHOUT CONTRAST  TECHNIQUE: Contiguous axial images were obtained from the base of the skull through the vertex without intravenous contrast.  COMPARISON:  None.  FINDINGS: No mass lesion. No midline shift. No acute hemorrhage or hematoma. No extra-axial fluid collections. No evidence of acute infarction. There is slight prominence of the lateral ventricles. Brain parenchyma appears normal. No osseous abnormality.  IMPRESSION: No significant abnormality. Critical Value/emergent results were called by telephone at the time of interpretation on 12/28/2014 at 4:51 pm to Dr. Thad Ranger, who verbally acknowledged these results.   Electronically Signed   By: Francene Boyers M.D.   On: 12/28/2014 16:51   Ct Angio Neck W/cm &/or Wo/cm  12/28/2014   CLINICAL DATA:  Expressive aphasia with headache beginning earlier today. Minimal neurologic symptoms currently. Initial encounter.  EXAM:  CT ANGIOGRAPHY HEAD rib  TECHNIQUE: Multidetector CT imaging of the head was performed using the standard protocol during bolus administration of intravenous contrast. Multiplanar CT image reconstructions and MIPs were obtained to evaluate the vascular anatomy.  CONTRAST:  80mL OMNIPAQUE IOHEXOL 350 MG/ML SOLN  COMPARISON:  Noncontrast CT head earlier today.  FINDINGS: CT HEAD  Calvarium and skull base: No fracture or destructive lesion. Mastoids and middle ears are grossly clear.  Paranasal sinuses: Imaged portions are clear.  Orbits: Negative.  Brain: No evidence of acute abnormality, including acute infarct, hemorrhage, hydrocephalus, or mass lesion.  CTA NECK  Aortic arch: Standard branching. Imaged portion shows no evidence of aneurysm or dissection. No significant stenosis of the major arch vessel origins.  Right carotid system: No evidence of dissection, stenosis (50% or greater) or occlusion. In frontal  Left carotid system: No evidence of dissection, stenosis (50% or greater) or occlusion.  Vertebral arteries: RIGHT vertebral dominant. No evidence of dissection, stenosis (50% or greater) or occlusion.  Nonvascular soft tissues: Cervical spondylosis. No lung apex lesion. No neck masses.  CTA HEAD  Anterior circulation: No significant stenosis, proximal occlusion, aneurysm, or vascular malformation.  Posterior circulation: No significant stenosis, proximal occlusion, aneurysm, or vascular malformation.  Venous sinuses: As permitted by contrast timing, patent.  Anatomic variants: None of significance.  Delayed phase:   No abnormal intracranial enhancement.  IMPRESSION: No extracranial or intracranial flow reducing lesion or dissection is observed. Specifically, no evidence for dissection.   Electronically Signed   By: Davonna Belling M.D.   On: 12/28/2014 18:05    EKG: Independently reviewed. normal sinus rhythm, nonspecific ST and T waves changes.  Assessment/Plan Principal Problem:   TIA (transient  ischemic attack) Active Problems:   Aphasia   Prostatitis, chronic   Fungal toenail infection   1. TIA (transient ischemic attack) The patient is presenting with complaints of slurred speech. He has expressive aphasia as well as dysarthria. Neurology was initially consulted and a code stroke was called. The patient was felt to be not a candidate for TPA due to minimal symptoms. CT of the head as well as CT angiogram of head and neck were unremarkable. Patient's symptoms have gradually resolved. Currently he is asymptomatic. Patient is recommended to be admitted for further workup. We will get MRI of the brain. Echocardiogram. Further workup depending on neurology recommendation. We will continue with aspirin per neurology.  2. Chronic prostatitis. Patient is not preferring to take any Cipro anymore. Closely monitoring.  3. Chronic  fungal toenail infection. Patient will continue fluconazole on discharge.  Advance goals of care discussion: Full code  Consults: Neurology  DVT Prophylaxis: subcutaneous Heparin Nutrition: Nothing by mouth until stroke evaluation  Disposition: Admitted to observation in telemetry unit.  Author: Lynden OxfordPranav Dale Ribeiro, MD [facility] Pager: [phone] 12/28/2014, 9:31 PM    If 7PM-7AM, please contact night-coverage [email or web address] Password [email or web address]

## 2014-12-28 NOTE — ED Provider Notes (Signed)
CSN: [id number]     Arrival date & time 12/28/14  1632 History   First MD Initiated Contact with Patient 12/28/14 1642     Chief Complaint  Patient presents with  . Code Stroke     (Consider location/radiation/quality/duration/timing/severity/associated sxs/prior Treatment) The history is provided by the patient.   patient was transferred from urgent care. Reportedly at around 3:00 today began to have some difficulty speaking. Began to have difficulty getting words out. Also had a headache. Has had headache on and off for the last couple weeks and is on Cipro for sinusitis and prostatitis. No visual changes or headaches. No numbness or weakness. No confusion. States he just had trouble getting words out and sometimes around warm,. He is an avid bicyclist and bicycles around 50 to 100 miles a week. No previous stroke. No chest pain. No trouble breathing. Code stroke called at urgent care and transferred emergently. I met him in immediately upon arrival in ER, along with the stroke team.  Past Medical History  Diagnosis Date  . PONV (postoperative nausea and vomiting)    Past Surgical History  Procedure Laterality Date  . Right shoulder surgery     . Back surgery      L4-5   . Knee surgery      bilateral   . Transurethral resection of prostate  07/31/2012    Procedure: TRANSURETHRAL RESECTION OF THE PROSTATE WITH GYRUS INSTRUMENTS;  Surgeon: Fredricka Bonine, MD;  Location: [facility];  Service: Urology;  Laterality: N/A;   Family History  Problem Relation Age of Onset  . Kidney disease Mother   . Diabetes Mother   . Coronary artery disease Mother   . Hypertension Mother   . COPD Father    History  Substance Use Topics  . Smoking status: Former Research scientist (life sciences)  . Smokeless tobacco: Never Used  . Alcohol Use: Yes     Comment: occasional wine     Review of Systems  Constitutional: Negative for activity change and appetite change.  Eyes: Negative for pain.  Respiratory: Negative for  chest tightness and shortness of breath.   Cardiovascular: Negative for chest pain and leg swelling.  Gastrointestinal: Negative for nausea, vomiting, abdominal pain and diarrhea.  Genitourinary: Negative for flank pain.  Musculoskeletal: Negative for back pain and neck stiffness.  Skin: Negative for rash.  Neurological: Positive for speech difficulty and headaches. Negative for weakness and numbness.  Psychiatric/Behavioral: Negative for behavioral problems.      Allergies  Cefaclor  Home Medications   Prior to Admission medications   Medication Sig Start Date End Date Taking? Authorizing Provider  ciprofloxacin (CIPRO) 500 MG tablet Take 500 mg by mouth 2 (two) times daily.   Yes Historical Provider, MD  fluconazole (DIFLUCAN) 200 MG tablet Take 200 mg by mouth once a week.  11/24/14  Yes Historical Provider, MD  aspirin 325 MG tablet Take 1 tablet (325 mg total) by mouth daily. 12/30/14   Hosie Poisson, MD  rosuvastatin (CRESTOR) 5 MG tablet Take 1 tablet (5 mg total) by mouth daily at 6 PM. 12/30/14   Hosie Poisson, MD   BP 115/66 mmHg  Pulse 57  Temp(Src) 97.8 F (36.6 C) (Oral)  Resp 18  Ht $R'5\' 9"'Yi$  (1.753 m)  Wt 180 lb 14.4 oz (82.056 kg)  BMI 26.70 kg/m2  SpO2 100% Physical Exam  Constitutional: He is oriented to person, place, and time. He appears well-developed and well-nourished.  HENT:  Head: Normocephalic and atraumatic.  Eyes:  EOM are normal. Pupils are equal, round, and reactive to light.  Neck: Normal range of motion. Neck supple.  Cardiovascular: Normal rate, regular rhythm and normal heart sounds.   No murmur heard. Pulmonary/Chest: Effort normal and breath sounds normal.  Abdominal: Soft. Bowel sounds are normal. He exhibits no distension and no mass. There is no tenderness. There is no rebound and no guarding.  Musculoskeletal: Normal range of motion. He exhibits no edema.  Neurological: He is alert and oriented to person, place, and time. No cranial nerve  deficit.  Decreasing bilaterally. Moving all extremity's. No nystagmus. Occasionally will have slight word finding.  Skin: Skin is warm and dry.  Psychiatric: He has a normal mood and affect.  Nursing note and vitals reviewed.   ED Course  Procedures (including critical care time) Labs Review Labs Reviewed  COMPREHENSIVE METABOLIC PANEL - Abnormal; Notable for the following:    GFR calc non Af Amer 59 (*)    GFR calc Af Amer 68 (*)    All other components within normal limits  LIPID PANEL - Abnormal; Notable for the following:    LDL Cholesterol 118 (*)    All other components within normal limits  COMPREHENSIVE METABOLIC PANEL - Abnormal; Notable for the following:    GFR calc non Af Amer 58 (*)    GFR calc Af Amer 68 (*)    Anion gap 4 (*)    All other components within normal limits  I-STAT CHEM 8, ED - Abnormal; Notable for the following:    Glucose, Bld 67 (*)    All other components within normal limits  I-STAT CHEM 8, ED - Abnormal; Notable for the following:    Glucose, Bld 64 (*)    All other components within normal limits  PROTIME-INR  APTT  CBC  DIFFERENTIAL  HEMOGLOBIN A1C  URINE RAPID DRUG SCREEN (HOSP PERFORMED)  CBC WITH DIFFERENTIAL/PLATELET  GLUCOSE, CAPILLARY  GLUCOSE, CAPILLARY  CBG MONITORING, ED  I-STAT TROPOININ, ED    Imaging Review No results found.   EKG Interpretation   Date/Time:  Wednesday December 28 2014 16:52:27 EDT Ventricular Rate:  52 PR Interval:  195 QRS Duration: 97 QT Interval:  470 QTC Calculation: 437 R Axis:   31 Text Interpretation:  Sinus rhythm Confirmed by Alvino Chapel  MD, Ovid Curd  [id number]) on 12/28/2014 5:41:03 PM      MDM   Final diagnoses:  Aphasia   patient with aphasia. Has improved. Does have occasional word finding of one word. Head CT and CTA reassuring. Seen by neurology also. Will admit for TIA/stroke rule out. Not a TPA candidate due to NIH score.    Davonna Belling, MD 12/31/14 [phone]

## 2014-12-28 NOTE — ED Provider Notes (Signed)
CSN: [id number]     Arrival date & time 12/28/14  1532 History   None    Chief Complaint  Patient presents with  . Headache   (Consider location/radiation/quality/duration/timing/severity/associated sxs/prior Treatment) Patient is a 74 y.o. male presenting with headaches. The history is provided by the patient and the spouse.  Headache Pain location:  Frontal Quality:  Dull Chronicity:  New Similar to prior headaches: no   Context comment:  Acute onset approx 1hr ago of expressive aphasia  noticed by co-workers and wife as different than nl., no dizziness, n/or v Associated symptoms: no dizziness and no weakness     Past Medical History  Diagnosis Date  . PONV (postoperative nausea and vomiting)    Past Surgical History  Procedure Laterality Date  . Right shoulder surgery     . Back surgery      L4-5   . Knee surgery      bilateral   . Transurethral resection of prostate  07/31/2012    Procedure: TRANSURETHRAL RESECTION OF THE PROSTATE WITH GYRUS INSTRUMENTS;  Surgeon: Antony HasteMatthew Ramsey Eskridge, MD;  Location: [facility];  Service: Urology;  Laterality: N/A;   Family History  Problem Relation Age of Onset  . Kidney disease Mother   . Diabetes Mother   . Coronary artery disease Mother   . Hypertension Mother   . COPD Father    History  Substance Use Topics  . Smoking status: Former Games developermoker  . Smokeless tobacco: Never Used  . Alcohol Use: Yes     Comment: occasional wine     Review of Systems  Constitutional: Negative.   Gastrointestinal: Negative.   Neurological: Positive for headaches. Negative for dizziness, speech difficulty and weakness.    Allergies  Cefaclor  Home Medications   Prior to Admission medications   Medication Sig Start Date End Date Taking? Authorizing Provider  ciprofloxacin (CIPRO) 500 MG tablet Take 500 mg by mouth 2 (two) times daily.   Yes Historical Provider, MD   There were no vitals taken for this visit. Physical Exam  Constitutional:  He is oriented to person, place, and time. He appears well-developed and well-nourished. No distress.  HENT:  Head: Normocephalic.  Eyes: Conjunctivae and EOM are normal. Pupils are equal, round, and reactive to light.  Neck: Trachea normal and normal range of motion. Neck supple. Normal carotid pulses present. Carotid bruit is not present.  Cardiovascular: Regular rhythm, normal heart sounds and intact distal pulses.   Pulmonary/Chest: Effort normal and breath sounds normal.  Lymphadenopathy:    He has no cervical adenopathy.  Neurological: He is alert and oriented to person, place, and time. No cranial nerve deficit.  Currently no apparent neuro deficit.  Skin: Skin is warm and dry.  Nursing note and vitals reviewed.   ED Course  Procedures (including critical care time) Labs Review Labs Reviewed - No data to display  Imaging Review No results found.   MDM   1. Expressive aphasia        Linna HoffJames D Kindl, MD 12/28/14 [phone]

## 2014-12-28 NOTE — ED Notes (Signed)
Patient coming by [facility] from urgent care. Called code stroke. Pt. Having expressive aphasia starting at 1500 with a HA.

## 2014-12-28 NOTE — ED Notes (Signed)
Reported HA, expressive aphasia x past hour PERLA, good grips bilateral

## 2014-12-28 NOTE — Consult Note (Signed)
Referring Physician: Rubin Payor    Chief Complaint: intermittent expressive difficulties.   HPI:                                                                                                                                         Brandon Stein is an 74 y.o. male who has no significant PMHx other than prostate infections and recent HA's.  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 Upon arrival staff noted he was having difficulty getting his words out.  He drove home and at first seemed ok but was noted by wife to have difficulty finding the words he wanted to say.  He was driven to Urgent care. While in waiting room he mentioned to his wife he had right sided cheek decreased sensation. Code stroke was called and patient was brought to hospital.  Upon arrival he was speaking fine and had one period of time which he could not recall the word he was going to use but quickly corrected it.   Date last known well: Date: 12/28/2014 Time last known well: Time: 14:40 tPA Given: No: Minimal symptoms Modified Rankin: Rankin Score=0    Past Medical History  Diagnosis Date  . PONV (postoperative nausea and vomiting)     Past Surgical History  Procedure Laterality Date  . Right shoulder surgery     . Back surgery      L4-5   . Knee surgery      bilateral   . Transurethral resection of prostate  07/31/2012    Procedure: TRANSURETHRAL RESECTION OF THE PROSTATE WITH GYRUS INSTRUMENTS;  Surgeon: Antony Haste, MD;  Location: [facility];  Service: Urology;  Laterality: N/A;    Family History  Problem Relation Age of Onset  . Kidney disease Mother   . Diabetes Mother   . Coronary artery disease Mother   . Hypertension Mother   . COPD Father    Social History:  reports that he has quit smoking. He has never used smokeless tobacco. He reports that he drinks alcohol. He reports that he does not use illicit drugs.  Allergies:  Allergies  Allergen Reactions  . Cefaclor     REACTION:  Hives    Medications:  No current facility-administered medications for this encounter.   Current Outpatient Prescriptions  Medication Sig Dispense Refill  . ciprofloxacin (CIPRO) 500 MG tablet Take 500 mg by mouth 2 (two) times daily.    . fluconazole (DIFLUCAN) 200 MG tablet Take 200 mg by mouth once a week.     Marland Kitchen ibuprofen (ADVIL,MOTRIN) 200 MG tablet Take 800 mg by mouth every 6 (six) hours as needed for headache.       ROS:                                                                                                                                       History obtained from the patient  General ROS: negative for - chills, fatigue, fever, night sweats, weight gain or weight loss Psychological ROS: negative for - behavioral disorder, hallucinations, memory difficulties, mood swings or suicidal ideation Ophthalmic ROS: negative for - blurry vision, double vision, eye pain or loss of vision ENT ROS: negative for - epistaxis, nasal discharge, oral lesions, sore throat, tinnitus or vertigo Allergy and Immunology ROS: negative for - hives or itchy/watery eyes Hematological and Lymphatic ROS: negative for - bleeding problems, bruising or swollen lymph nodes Endocrine ROS: negative for - galactorrhea, hair pattern changes, polydipsia/polyuria or temperature intolerance Respiratory ROS: negative for - cough, hemoptysis, shortness of breath or wheezing Cardiovascular ROS: negative for - chest pain, dyspnea on exertion, edema or irregular heartbeat Gastrointestinal ROS: negative for - abdominal pain, diarrhea, hematemesis, nausea/vomiting or stool incontinence Genito-Urinary ROS: negative for - dysuria, hematuria, incontinence or urinary frequency/urgency Musculoskeletal ROS: negative for - joint swelling or muscular weakness Neurological ROS: as noted in  HPI Dermatological ROS: negative for rash and skin lesion changes  Neurologic Examination:                                                                                                      Blood pressure 161/83, pulse 50, temperature 98.5 F (36.9 C), temperature source Oral, resp. rate 22, SpO2 99 %.  HEENT-  Normocephalic, no lesions, without obvious abnormality.  Normal external eye and conjunctiva.  Normal TM's bilaterally.  Normal auditory canals and external ears. Normal external nose, mucus membranes and septum.  Normal pharynx. Cardiovascular- S1, S2 normal, pulses palpable throughout   Lungs- chest clear, no wheezing, rales, normal symmetric air entry Abdomen- normal findings: bowel sounds normal Extremities- no edema Lymph-no adenopathy palpable Musculoskeletal-no joint tenderness, deformity or swelling Skin-warm and dry, no hyperpigmentation, vitiligo, or suspicious lesions  Neurological Examination Mental Status: Alert, oriented, thought content appropriate.  Speech fluent with intermittent word finding difficulty.  Able to follow 3 step commands without difficulty. Cranial Nerves: II: Discs flat bilaterally; Visual fields grossly normal, pupils equal, round, reactive to light and accommodation III,IV, VI: ptosis not present, extra-ocular motions intact bilaterally V,VII: smile symmetric, facial light touch sensation normal bilaterally VIII: hearing normal bilaterally IX,X: uvula rises symmetrically XI: bilateral shoulder shrug XII: midline tongue extension Motor: Right : Upper extremity   5/5    Left:     Upper extremity   5/5  Lower extremity   5/5     Lower extremity   5/5 Tone and bulk:normal tone throughout; no atrophy noted Sensory: Pinprick and light touch intact throughout, bilaterally Deep Tendon Reflexes: 2+ and symmetric throughout Plantars: Right: downgoing   Left: downgoing Cerebellar: normal finger-to-nose and normal heel-to-shin test Gait: Not tested  due to multiple leads.        Lab Results: Basic Metabolic Panel:  Recent Labs Lab 12/28/14 1704  NA 140  K 4.1  CL 104  GLUCOSE 67*  BUN 19  CREATININE 1.10    Liver Function Tests: No results for input(s): AST, ALT, ALKPHOS, BILITOT, PROT, ALBUMIN in the last 168 hours. No results for input(s): LIPASE, AMYLASE in the last 168 hours. No results for input(s): AMMONIA in the last 168 hours.  CBC:  Recent Labs Lab 12/28/14 1636 12/28/14 1704  WBC 5.3  --   NEUTROABS 3.4  --   HGB 16.0 16.0  HCT 45.1 47.0  MCV 85.1  --   PLT 185  --     Cardiac Enzymes: No results for input(s): CKTOTAL, CKMB, CKMBINDEX, TROPONINI in the last 168 hours.  Lipid Panel: No results for input(s): CHOL, TRIG, HDL, CHOLHDL, VLDL, LDLCALC in the last 168 hours.  CBG:  Recent Labs Lab 12/28/14 1648  GLUCAP 79    Microbiology: Results for orders placed or performed during the hospital encounter of 07/28/12  Surgical pcr screen     Status: None   Collection Time: 07/28/12  9:25 AM  Result Value Ref Range Status   MRSA, PCR NEGATIVE NEGATIVE Final   Staphylococcus aureus NEGATIVE NEGATIVE Final    Comment:        The Xpert SA Assay (FDA approved for NASAL specimens in patients over 72 years of age), is one component of a comprehensive surveillance program.  Test performance has been validated by [facility] for patients greater than or equal to 72 year old. It is not intended to diagnose infection nor to guide or monitor treatment.    Coagulation Studies:  Recent Labs  12/28/14 1636  LABPROT 14.4  INR 1.11    Imaging: Ct Head (brain) Wo Contrast  12/28/2014   CLINICAL DATA:  Code stroke. Headache. Double vision. Difficulty speaking at times.  EXAM: CT HEAD WITHOUT CONTRAST  TECHNIQUE: Contiguous axial images were obtained from the base of the skull through the vertex without intravenous contrast.  COMPARISON:  None.  FINDINGS: No mass lesion. No midline shift. No  acute hemorrhage or hematoma. No extra-axial fluid collections. No evidence of acute infarction. There is slight prominence of the lateral ventricles. Brain parenchyma appears normal. No osseous abnormality.  IMPRESSION: No significant abnormality. Critical Value/emergent results were called by telephone at the time of interpretation on 12/28/2014 at 4:51 pm to Dr. Thad Rangereynolds, who verbally acknowledged these results.   Electronically Signed   By: Francene BoyersJames  Maxwell M.D.   On:  12/28/2014 16:51   Felicie Morn PA-C [facility] [phone]  12/28/2014, 5:38 PM   Patient seen and examined.  Clinical course and management discussed.  Necessary edits performed.  I agree with the above.  Assessment and plan of care developed and discussed below.    Assessment: 74 y.o. male presenting to ED as a code stroke with intermittent word finding difficulty and severe headache.  Neurological examination is otherwise non-focal.  Head CT personally reviewed and shows no acute changes.  Patient without stroke risk factors and on antiplatelet or anticoagulation therapy at home.  Further work up recommended.  Stroke Risk Factors - none  Recommendations: 1.  CTA of head and neck to rule out dissection 2.  Frequent neuro checks 3.  NPO until RN stroke swallow screen 4.  Telemetry 5.  If no abnormalities noted on CTA stroke work up recommended to include MRI of the brain without contrast, echocardiogram, A1c and lipid panel.  6.  ASA  daily    Thana Farr, MD [facility] [phone]  12/28/2014  5:46 PM

## 2014-12-29 DIAGNOSIS — G451 Carotid artery syndrome (hemispheric): Secondary | ICD-10-CM

## 2014-12-29 DIAGNOSIS — R4701 Aphasia: Secondary | ICD-10-CM | POA: Diagnosis not present

## 2014-12-29 DIAGNOSIS — G459 Transient cerebral ischemic attack, unspecified: Secondary | ICD-10-CM | POA: Diagnosis not present

## 2014-12-29 LAB — CBC WITH DIFFERENTIAL/PLATELET
BASOS ABS: 0 10*3/uL (ref 0.0–0.1)
Basophils Relative: 0 % (ref 0–1)
EOS PCT: 2 % (ref 0–5)
Eosinophils Absolute: 0.1 10*3/uL (ref 0.0–0.7)
HCT: 44.7 % (ref 39.0–52.0)
Hemoglobin: 16 g/dL (ref 13.0–17.0)
LYMPHS ABS: 1.5 10*3/uL (ref 0.7–4.0)
LYMPHS PCT: 31 % (ref 12–46)
MCH: 30.4 pg (ref 26.0–34.0)
MCHC: 35.8 g/dL (ref 30.0–36.0)
MCV: 85 fL (ref 78.0–100.0)
Monocytes Absolute: 0.4 10*3/uL (ref 0.1–1.0)
Monocytes Relative: 8 % (ref 3–12)
NEUTROS ABS: 2.9 10*3/uL (ref 1.7–7.7)
Neutrophils Relative %: 59 % (ref 43–77)
PLATELETS: 166 10*3/uL (ref 150–400)
RBC: 5.26 MIL/uL (ref 4.22–5.81)
RDW: 13.8 % (ref 11.5–15.5)
WBC: 4.9 10*3/uL (ref 4.0–10.5)

## 2014-12-29 LAB — COMPREHENSIVE METABOLIC PANEL
ALT: 18 U/L (ref 0–53)
AST: 21 U/L (ref 0–37)
Albumin: 3.8 g/dL (ref 3.5–5.2)
Alkaline Phosphatase: 65 U/L (ref 39–117)
Anion gap: 4 — ABNORMAL LOW (ref 5–15)
BUN: 14 mg/dL (ref 6–23)
CHLORIDE: 108 mmol/L (ref 96–112)
CO2: 28 mmol/L (ref 19–32)
Calcium: 9.4 mg/dL (ref 8.4–10.5)
Creatinine, Ser: 1.21 mg/dL (ref 0.50–1.35)
GFR, EST AFRICAN AMERICAN: 68 mL/min — AB (ref >90)
GFR, EST NON AFRICAN AMERICAN: 58 mL/min — AB (ref >90)
Glucose, Bld: 92 mg/dL (ref 70–99)
Potassium: 4.4 mmol/L (ref 3.5–5.1)
Sodium: 140 mmol/L (ref 135–145)
Total Bilirubin: 0.8 mg/dL (ref 0.3–1.2)
Total Protein: 6.3 g/dL (ref 6.0–8.3)

## 2014-12-29 LAB — RAPID URINE DRUG SCREEN, HOSP PERFORMED
Amphetamines: NOT DETECTED
BARBITURATES: NOT DETECTED
Benzodiazepines: NOT DETECTED
Cocaine: NOT DETECTED
Opiates: NOT DETECTED
TETRAHYDROCANNABINOL: NOT DETECTED

## 2014-12-29 LAB — LIPID PANEL
CHOL/HDL RATIO: 3.9 ratio
CHOLESTEROL: 179 mg/dL (ref 0–200)
HDL: 46 mg/dL (ref >39)
LDL Cholesterol: 118 mg/dL — ABNORMAL HIGH (ref 0–99)
TRIGLYCERIDES: 73 mg/dL (ref <150)
VLDL: 15 mg/dL (ref 0–40)

## 2014-12-29 LAB — GLUCOSE, CAPILLARY: GLUCOSE-CAPILLARY: 96 mg/dL (ref 70–99)

## 2014-12-29 MED ORDER — ROSUVASTATIN CALCIUM 5 MG PO TABS
5.0000 mg | ORAL_TABLET | Freq: Every day | ORAL | Status: DC
  Filled 2014-12-29 (×2): qty 1

## 2014-12-29 NOTE — Progress Notes (Signed)
UR completed 

## 2014-12-29 NOTE — Progress Notes (Signed)
Subjective: No further symptoms while in hospital.   Objective: Current vital signs: BP 113/73 mmHg  Pulse 55  Temp(Src) 98.2 F (36.8 C) (Oral)  Resp 17  Ht  (1.753 m)  Wt 82.056 kg (180 lb 14.4 oz)  BMI 26.70 kg/m2  SpO2 98% Vital signs in last 24 hours: Temp:  [97.4 F (36.3 C)-98.5 F (36.9 C)] 98.2 F (36.8 C) (03/31 0957) Pulse Rate:  [46-57] 55 (03/31 0957) Resp:  [13-22] 17 (03/31 0957) BP: (113-170)/(67-89) 113/73 mmHg (03/31 0957) SpO2:  [96 %-100 %] 98 % (03/31 0957) Weight:  [79.379 kg (175 lb)-82.056 kg (180 lb 14.4 oz)] 82.056 kg (180 lb 14.4 oz) (03/30 2051)  Intake/Output from previous day:   Intake/Output this shift:   Nutritional status: Diet regular Room service appropriate?: Yes; Fluid consistency:: Thin  Neurologic Exam: Mental Status: Alert, oriented, thought content appropriate. Speech fluent with no aphasia. Able to follow 3 step commands without difficulty. Cranial Nerves: II: Visual fields grossly normal, pupils equal, round, reactive to light and accommodation III,IV, VI: ptosis not present, extra-ocular motions intact bilaterally V,VII: smile symmetric, facial light touch sensation normal bilaterally VIII: hearing normal bilaterally IX,X: uvula rises symmetrically XI: bilateral shoulder shrug XII: midline tongue extension Motor: Right :Upper extremity 5/5Left: Upper extremity 5/5 Lower extremity 5/5Lower extremity 5/5 Tone and bulk:normal tone throughout; no atrophy noted Sensory: Pinprick and light touch intact throughout, bilaterally Deep Tendon Reflexes: 2+ and symmetric throughout Plantars: Right: downgoingLeft: downgoing     Lab Results: Basic Metabolic Panel:  Recent Labs Lab 12/28/14 1636 12/28/14 1704 12/28/14 1739 12/29/14 0659  NA 140 140 141 140  K 4.2 4.1 4.2 4.4   CL 105 104 104 108  CO2 25  --   --  28  GLUCOSE 73 67* 64* 92  BUN CREATININE 1.20 1.10 1.20 1.21  CALCIUM 9.8  --   --  9.4    Liver Function Tests:  Recent Labs Lab 12/28/14 1636 12/29/14 0659  AST 25 21  ALT 20 18  ALKPHOS 72 65  BILITOT 0.9 0.8  PROT 6.6 6.3  ALBUMIN 4.2 3.8   No results for input(s): LIPASE, AMYLASE in the last 168 hours. No results for input(s): AMMONIA in the last 168 hours.  CBC:  Recent Labs Lab 12/28/14 1636 12/28/14 1704 12/28/14 1739 12/29/14 0659  WBC 5.3  --   --  4.9  NEUTROABS 3.4  --   --  2.9  HGB 16.0 16.0 16.3 16.0  HCT 45.1 47.0 48.0 44.7  MCV 85.1  --   --  85.0  PLT 185  --   --  166    Cardiac Enzymes: No results for input(s): CKTOTAL, CKMB, CKMBINDEX, TROPONINI in the last 168 hours.  Lipid Panel:  Recent Labs Lab 12/29/14 0659  CHOL 179  TRIG 73  HDL 46  CHOLHDL 3.9  VLDL 15  LDLCALC 454*    CBG:  Recent Labs Lab 12/28/14 1648 12/28/14 2248 12/29/14 0640  GLUCAP 79 81 96    Microbiology: Results for orders placed or performed during the hospital encounter of 07/28/12  Surgical pcr screen     Status: None   Collection Time: 07/28/12  9:25 AM  Result Value Ref Range Status   MRSA, PCR NEGATIVE NEGATIVE Final   Staphylococcus aureus NEGATIVE NEGATIVE Final    Comment:        The Xpert SA Assay (FDA approved for NASAL specimens in patients over 21  years of age), is one component of a comprehensive surveillance program.  Test performance has been validated by [facility] for patients greater than or equal to 8 year old. It is not intended to diagnose infection nor to guide or monitor treatment.    Coagulation Studies:  Recent Labs  12/28/14 1636  LABPROT 14.4  INR 1.11    Imaging: Ct Angio Head W/cm &/or Wo Cm  12/28/2014   CLINICAL DATA:  Expressive aphasia with headache beginning earlier today. Minimal neurologic symptoms currently. Initial encounter.  EXAM: CT  ANGIOGRAPHY HEAD rib  TECHNIQUE: Multidetector CT imaging of the head was performed using the standard protocol during bolus administration of intravenous contrast. Multiplanar CT image reconstructions and MIPs were obtained to evaluate the vascular anatomy.  CONTRAST:  80mL OMNIPAQUE IOHEXOL 350 MG/ML SOLN  COMPARISON:  Noncontrast CT head earlier today.  FINDINGS: CT HEAD  Calvarium and skull base: No fracture or destructive lesion. Mastoids and middle ears are grossly clear.  Paranasal sinuses: Imaged portions are clear.  Orbits: Negative.  Brain: No evidence of acute abnormality, including acute infarct, hemorrhage, hydrocephalus, or mass lesion.  CTA NECK  Aortic arch: Standard branching. Imaged portion shows no evidence of aneurysm or dissection. No significant stenosis of the major arch vessel origins.  Right carotid system: No evidence of dissection, stenosis (50% or greater) or occlusion. In frontal  Left carotid system: No evidence of dissection, stenosis (50% or greater) or occlusion.  Vertebral arteries: RIGHT vertebral dominant. No evidence of dissection, stenosis (50% or greater) or occlusion.  Nonvascular soft tissues: Cervical spondylosis. No lung apex lesion. No neck masses.  CTA HEAD  Anterior circulation: No significant stenosis, proximal occlusion, aneurysm, or vascular malformation.  Posterior circulation: No significant stenosis, proximal occlusion, aneurysm, or vascular malformation.  Venous sinuses: As permitted by contrast timing, patent.  Anatomic variants: None of significance.  Delayed phase:   No abnormal intracranial enhancement.  IMPRESSION: No extracranial or intracranial flow reducing lesion or dissection is observed. Specifically, no evidence for dissection.   Electronically Signed   By: Davonna Belling M.D.   On: 12/28/2014 18:05   Ct Head (brain) Wo Contrast  12/28/2014   CLINICAL DATA:  Code stroke. Headache. Double vision. Difficulty speaking at times.  EXAM: CT HEAD WITHOUT  CONTRAST  TECHNIQUE: Contiguous axial images were obtained from the base of the skull through the vertex without intravenous contrast.  COMPARISON:  None.  FINDINGS: No mass lesion. No midline shift. No acute hemorrhage or hematoma. No extra-axial fluid collections. No evidence of acute infarction. There is slight prominence of the lateral ventricles. Brain parenchyma appears normal. No osseous abnormality.  IMPRESSION: No significant abnormality. Critical Value/emergent results were called by telephone at the time of interpretation on 12/28/2014 at 4:51 pm to Dr. Thad Ranger, who verbally acknowledged these results.   Electronically Signed   By: Francene Boyers M.D.   On: 12/28/2014 16:51   Ct Angio Neck W/cm &/or Wo/cm  12/28/2014   CLINICAL DATA:  Expressive aphasia with headache beginning earlier today. Minimal neurologic symptoms currently. Initial encounter.  EXAM: CT ANGIOGRAPHY HEAD rib  TECHNIQUE: Multidetector CT imaging of the head was performed using the standard protocol during bolus administration of intravenous contrast. Multiplanar CT image reconstructions and MIPs were obtained to evaluate the vascular anatomy.  CONTRAST:  80mL OMNIPAQUE IOHEXOL 350 MG/ML SOLN  COMPARISON:  Noncontrast CT head earlier today.  FINDINGS: CT HEAD  Calvarium and skull base: No fracture or destructive lesion. Mastoids and  middle ears are grossly clear.  Paranasal sinuses: Imaged portions are clear.  Orbits: Negative.  Brain: No evidence of acute abnormality, including acute infarct, hemorrhage, hydrocephalus, or mass lesion.  CTA NECK  Aortic arch: Standard branching. Imaged portion shows no evidence of aneurysm or dissection. No significant stenosis of the major arch vessel origins.  Right carotid system: No evidence of dissection, stenosis (50% or greater) or occlusion. In frontal  Left carotid system: No evidence of dissection, stenosis (50% or greater) or occlusion.  Vertebral arteries: RIGHT vertebral dominant. No  evidence of dissection, stenosis (50% or greater) or occlusion.  Nonvascular soft tissues: Cervical spondylosis. No lung apex lesion. No neck masses.  CTA HEAD  Anterior circulation: No significant stenosis, proximal occlusion, aneurysm, or vascular malformation.  Posterior circulation: No significant stenosis, proximal occlusion, aneurysm, or vascular malformation.  Venous sinuses: As permitted by contrast timing, patent.  Anatomic variants: None of significance.  Delayed phase:   No abnormal intracranial enhancement.  IMPRESSION: No extracranial or intracranial flow reducing lesion or dissection is observed. Specifically, no evidence for dissection.   Electronically Signed   By: Davonna BellingJohn  Curnes M.D.   On: 12/28/2014 18:05   Mri Brain Without Contrast  12/29/2014   CLINICAL DATA:  Word-finding difficulties, decreased RIGHT face sensation. Evaluate aphasia. History of prostatitis and recent headaches.  EXAM: MRI HEAD WITHOUT CONTRAST  TECHNIQUE: Multiplanar, multiecho pulse sequences of the brain and surrounding structures were obtained without intravenous contrast.  COMPARISON:  CT of the head December 28, 2014 and CT angiogram of the head December 28, 2014 at 1731 hours  FINDINGS: No reduced diffusion to suggest acute ischemia. No susceptibility artifact to suggest hemorrhage. Ventricles and sulci are normal for patient's age. No abnormal parenchymal signal, mass lesions, mass effect. No abnormal extra-axial fluid collections. Normal major intracranial vascular flow voids seen at the skull base.  Ocular globes and orbital contents are unremarkable. Trace paranasal sinus mucosal thickening without air-fluid levels. The mastoid air cells are well aerated. No abnormal sellar expansion. No cerebellar tonsillar ectopia. No suspicious calvarial bone marrow signal.  IMPRESSION: No acute ischemia ; normal noncontrast MRI of the brain for age.   Electronically Signed   By: Awilda Metroourtnay  Bloomer   On: 12/29/2014 00:05     Medications:  Scheduled: . aspirin  325 mg Oral Daily  . enoxaparin (LOVENOX) injection  40 mg Subcutaneous Q24H    Felicie MornDavid Smith PA-C [facility] [phone]  12/29/2014, 10:16 AM   Patient seen and examined.  Clinical course and management discussed.  Necessary edits performed.  I agree with the above.  Assessment and plan of care developed and discussed below.    Assessment/Plan: 74 YO male presenting to ED with intermittent word finding difficulty and HA. He has had no further word finding difficulty over night.  MRI of the brain personally reviewed and shows no acute changes.  LDL 118.  A1c and Echo pending.   Recommendations: 1. Will follow up results of work up  2.  Continue ASA daily 3.  Statin for lipid management.     Thana FarrLeslie Liset Mcmonigle, MD [facility] [phone]  12/29/2014  11:39 AM

## 2014-12-29 NOTE — Progress Notes (Signed)
[facility] PROGRESS NOTE  Brandon Stein BJY:[id number] DOB: 09/15/1941 DOA: 12/28/2014 PCP: Johny BlamerHARRIS, WILLIAM, MD Interim summary: 74 year old male admitted for slurred speech. TIA work up ordered.  Assessment/Plan: 1. Slurred speech: CT / Mri is negative. Echo and carotid duplex ordered and pending.  LDL is 118, Started on statin.    Code Status: full code Family Communication: wife at bedside Disposition Plan: pending.    Consultants:  neurology  Procedures: MRI  Echocardiogram Carotid duplex CT head  Antibiotics:  none  HPI/Subjective: Comfortable denies any new complaints.   Objective: Filed Vitals:   12/29/14 1435  BP: 126/68  Pulse: 52  Temp: 97.9 F (36.6 C)  Resp: 18   No intake or output data in the 24 hours ending 12/29/14 1708 Filed Weights   12/28/14 1656 12/28/14 2051  Weight: 79.379 kg (175 lb) 82.056 kg (180 lb 14.4 oz)    Exam:   General:  Alert afebrile comfortable  Cardiovascular: s1s2  Respiratory: ctab  Abdomen: soft non tender non distended bowel sounds heard.   Musculoskeletal: no pedal edema.   Data Reviewed: Basic Metabolic Panel:  Recent Labs Lab 12/28/14 1636 12/28/14 1704 12/28/14 1739 12/29/14 0659  NA 140 140 141 140  K 4.2 4.1 4.2 4.4  CL 105 104 104 108  CO2 25  --   --  28  GLUCOSE 73 67* 64* 92  BUN 14 19 19 14   CREATININE 1.20 1.10 1.20 1.21  CALCIUM 9.8  --   --  9.4   Liver Function Tests:  Recent Labs Lab 12/28/14 1636 12/29/14 0659  AST 25 21  ALT 20 18  ALKPHOS 72 65  BILITOT 0.9 0.8  PROT 6.6 6.3  ALBUMIN 4.2 3.8   No results for input(s): LIPASE, AMYLASE in the last 168 hours. No results for input(s): AMMONIA in the last 168 hours. CBC:  Recent Labs Lab 12/28/14 1636 12/28/14 1704 12/28/14 1739 12/29/14 0659  WBC 5.3  --   --  4.9  NEUTROABS 3.4  --   --  2.9  HGB 16.0 16.0 16.3 16.0  HCT 45.1 47.0 48.0 44.7  MCV 85.1  --   --  85.0  PLT 185  --   --  166    Cardiac Enzymes: No results for input(s): CKTOTAL, CKMB, CKMBINDEX, TROPONINI in the last 168 hours. BNP (last 3 results) No results for input(s): BNP in the last 8760 hours.  ProBNP (last 3 results) No results for input(s): PROBNP in the last 8760 hours.  CBG:  Recent Labs Lab 12/28/14 1648 12/28/14 2248 12/29/14 0640  GLUCAP 79 81 96    No results found for this or any previous visit (from the past 240 hour(s)).   Studies: Ct Angio Head W/cm &/or Wo Cm  12/28/2014   CLINICAL DATA:  Expressive aphasia with headache beginning earlier today. Minimal neurologic symptoms currently. Initial encounter.  EXAM: CT ANGIOGRAPHY HEAD rib  TECHNIQUE: Multidetector CT imaging of the head was performed using the standard protocol during bolus administration of intravenous contrast. Multiplanar CT image reconstructions and MIPs were obtained to evaluate the vascular anatomy.  CONTRAST:  80mL OMNIPAQUE IOHEXOL 350 MG/ML SOLN  COMPARISON:  Noncontrast CT head earlier today.  FINDINGS: CT HEAD  Calvarium and skull base: No fracture or destructive lesion. Mastoids and middle ears are grossly clear.  Paranasal sinuses: Imaged portions are clear.  Orbits: Negative.  Brain: No evidence of acute abnormality, including acute infarct, hemorrhage, hydrocephalus, or mass lesion.  CTA NECK  Aortic arch: Standard branching. Imaged portion shows no evidence of aneurysm or dissection. No significant stenosis of the major arch vessel origins.  Right carotid system: No evidence of dissection, stenosis (50% or greater) or occlusion. In frontal  Left carotid system: No evidence of dissection, stenosis (50% or greater) or occlusion.  Vertebral arteries: RIGHT vertebral dominant. No evidence of dissection, stenosis (50% or greater) or occlusion.  Nonvascular soft tissues: Cervical spondylosis. No lung apex lesion. No neck masses.  CTA HEAD  Anterior circulation: No significant stenosis, proximal occlusion, aneurysm, or  vascular malformation.  Posterior circulation: No significant stenosis, proximal occlusion, aneurysm, or vascular malformation.  Venous sinuses: As permitted by contrast timing, patent.  Anatomic variants: None of significance.  Delayed phase:   No abnormal intracranial enhancement.  IMPRESSION: No extracranial or intracranial flow reducing lesion or dissection is observed. Specifically, no evidence for dissection.   Electronically Signed   By: Davonna Belling M.D.   On: 12/28/2014 18:05   Ct Head (brain) Wo Contrast  12/28/2014   CLINICAL DATA:  Code stroke. Headache. Double vision. Difficulty speaking at times.  EXAM: CT HEAD WITHOUT CONTRAST  TECHNIQUE: Contiguous axial images were obtained from the base of the skull through the vertex without intravenous contrast.  COMPARISON:  None.  FINDINGS: No mass lesion. No midline shift. No acute hemorrhage or hematoma. No extra-axial fluid collections. No evidence of acute infarction. There is slight prominence of the lateral ventricles. Brain parenchyma appears normal. No osseous abnormality.  IMPRESSION: No significant abnormality. Critical Value/emergent results were called by telephone at the time of interpretation on 12/28/2014 at 4:51 pm to Dr. Thad Ranger, who verbally acknowledged these results.   Electronically Signed   By: Francene Boyers M.D.   On: 12/28/2014 16:51   Ct Angio Neck W/cm &/or Wo/cm  12/28/2014   CLINICAL DATA:  Expressive aphasia with headache beginning earlier today. Minimal neurologic symptoms currently. Initial encounter.  EXAM: CT ANGIOGRAPHY HEAD rib  TECHNIQUE: Multidetector CT imaging of the head was performed using the standard protocol during bolus administration of intravenous contrast. Multiplanar CT image reconstructions and MIPs were obtained to evaluate the vascular anatomy.  CONTRAST:  80mL OMNIPAQUE IOHEXOL 350 MG/ML SOLN  COMPARISON:  Noncontrast CT head earlier today.  FINDINGS: CT HEAD  Calvarium and skull base: No fracture or  destructive lesion. Mastoids and middle ears are grossly clear.  Paranasal sinuses: Imaged portions are clear.  Orbits: Negative.  Brain: No evidence of acute abnormality, including acute infarct, hemorrhage, hydrocephalus, or mass lesion.  CTA NECK  Aortic arch: Standard branching. Imaged portion shows no evidence of aneurysm or dissection. No significant stenosis of the major arch vessel origins.  Right carotid system: No evidence of dissection, stenosis (50% or greater) or occlusion. In frontal  Left carotid system: No evidence of dissection, stenosis (50% or greater) or occlusion.  Vertebral arteries: RIGHT vertebral dominant. No evidence of dissection, stenosis (50% or greater) or occlusion.  Nonvascular soft tissues: Cervical spondylosis. No lung apex lesion. No neck masses.  CTA HEAD  Anterior circulation: No significant stenosis, proximal occlusion, aneurysm, or vascular malformation.  Posterior circulation: No significant stenosis, proximal occlusion, aneurysm, or vascular malformation.  Venous sinuses: As permitted by contrast timing, patent.  Anatomic variants: None of significance.  Delayed phase:   No abnormal intracranial enhancement.  IMPRESSION: No extracranial or intracranial flow reducing lesion or dissection is observed. Specifically, no evidence for dissection.   Electronically Signed   By: Davonna Belling  M.D.   On: 12/28/2014 18:05   Mri Brain Without Contrast  12/29/2014   CLINICAL DATA:  Word-finding difficulties, decreased RIGHT face sensation. Evaluate aphasia. History of prostatitis and recent headaches.  EXAM: MRI HEAD WITHOUT CONTRAST  TECHNIQUE: Multiplanar, multiecho pulse sequences of the brain and surrounding structures were obtained without intravenous contrast.  COMPARISON:  CT of the head December 28, 2014 and CT angiogram of the head December 28, 2014 at 1731 hours  FINDINGS: No reduced diffusion to suggest acute ischemia. No susceptibility artifact to suggest hemorrhage. Ventricles  and sulci are normal for patient's age. No abnormal parenchymal signal, mass lesions, mass effect. No abnormal extra-axial fluid collections. Normal major intracranial vascular flow voids seen at the skull base.  Ocular globes and orbital contents are unremarkable. Trace paranasal sinus mucosal thickening without air-fluid levels. The mastoid air cells are well aerated. No abnormal sellar expansion. No cerebellar tonsillar ectopia. No suspicious calvarial bone marrow signal.  IMPRESSION: No acute ischemia ; normal noncontrast MRI of the brain for age.   Electronically Signed   By: Awilda Metro   On: 12/29/2014 00:05    Scheduled Meds: . aspirin  325 mg Oral Daily  . enoxaparin (LOVENOX) injection  40 mg Subcutaneous Q24H  . rosuvastatin  5 mg Oral q1800   Continuous Infusions:   Principal Problem:   TIA (transient ischemic attack) Active Problems:   Aphasia   Prostatitis, chronic   Fungal toenail infection    Time spent: 25 minutes    [facility]  [facility] Pager [phone]. If 7PM-7AM, please contact night-coverage at [email or web address], password [facility] 12/29/2014, 5:08 PM

## 2014-12-29 NOTE — Progress Notes (Signed)
  Echocardiogram 2D Echocardiogram has been performed.  Brandon Stein, Brandon Stein 12/29/2014, 4:42 PM

## 2014-12-29 NOTE — Progress Notes (Signed)
Pt requested for this nurse to contact Md to let her know that pt is requesting to go home as soon as possible because he has a $400 deductible per day to pay. He refused his Crestor stating that he has made an appt with his PCP for tomorrow at 2pm and he wants to discuss the new medication with him. Pt educated on statin ordered but he continued to refuse.  Md notified. Pending results of Echo and carotid. Wife at bedside.

## 2014-12-29 NOTE — Progress Notes (Signed)
VASCULAR LAB PRELIMINARY  PRELIMINARY  PRELIMINARY  PRELIMINARY  Carotid duplex completed.    Preliminary report:  Bilateral:  1-39% ICA stenosis.  Vertebral artery flow is antegrade.     Brandon Stein, RVS 12/29/2014, 6:37 PM

## 2014-12-30 DIAGNOSIS — G459 Transient cerebral ischemic attack, unspecified: Secondary | ICD-10-CM | POA: Diagnosis not present

## 2014-12-30 DIAGNOSIS — R4701 Aphasia: Secondary | ICD-10-CM | POA: Diagnosis not present

## 2014-12-30 DIAGNOSIS — G451 Carotid artery syndrome (hemispheric): Secondary | ICD-10-CM | POA: Diagnosis not present

## 2014-12-30 LAB — HEMOGLOBIN A1C
HEMOGLOBIN A1C: 5.1 % (ref 4.8–5.6)
MEAN PLASMA GLUCOSE: 100 mg/dL

## 2014-12-30 MED ORDER — ASPIRIN 325 MG PO TABS: 325.0000 mg | ORAL_TABLET | Freq: Every day | ORAL | Status: AC

## 2014-12-30 MED ORDER — ROSUVASTATIN CALCIUM 5 MG PO TABS: 5.0000 mg | ORAL_TABLET | Freq: Every day | ORAL | Status: AC

## 2014-12-30 NOTE — Progress Notes (Signed)
OT Cancellation Note  Patient Details Name: Brandon Stein MRN: [id number] DOB: 12/12/1940   Cancelled Treatment:    Reason Eval/Treat Not Completed: OT screened, no needs identified, will sign off. Patient independently ambulating throughout room. No identified balance issues, patient putting shoes on chair in standing position to tie them. No identified visual problems at this time.   Miabella Shannahan , MS, OTR/L, CLT Pager: [phone]  12/30/2014, 8:20 AM

## 2014-12-30 NOTE — Evaluation (Signed)
Physical Therapy Evaluation Patient Details Name: Brandon Stein MRN: [id number] DOB: 04/11/1941 Today's Date: 12/30/2014   History of Present Illness  74 YO male presenting to ED with intermittent word finding difficulty and HA.  MRI no acute changes.  Clinical Impression  Patient presents at functional baseline.  No further skilled PT needs.  Did review stroke prevention and warning signs with patient.  Will d/c PT.    Follow Up Recommendations No PT follow up    Equipment Recommendations  None recommended by PT    Recommendations for Other Services       Precautions / Restrictions Precautions Precautions: None      Mobility  Bed Mobility               General bed mobility comments: up in chair  Transfers Overall transfer level: Independent                  Ambulation/Gait Ambulation/Gait assistance: Independent Ambulation Distance (Feet): 25 Feet Assistive device: None Gait Pattern/deviations: WFL(Within Functional Limits)        Stairs            Wheelchair Mobility    Modified Rankin (Stroke Patients Only) Modified Rankin (Stroke Patients Only) Pre-Morbid Rankin Score: No symptoms Modified Rankin: No symptoms     Balance Overall balance assessment: Independent                               Standardized Balance Assessment Standardized Balance Assessment : Berg Balance Test Berg Balance Test Sit to Stand: Able to stand without using hands and stabilize independently Standing Unsupported: Able to stand safely 2 minutes Sitting with Back Unsupported but Feet Supported on Floor or Stool: Able to sit safely and securely 2 minutes Stand to Sit: Sits safely with minimal use of hands Transfers: Able to transfer safely, minor use of hands From Standing, Reach Forward with Outstretched Arm: Can reach confidently >25 cm (10") From Standing Position, Pick up Object from Floor: Able to pick up shoe safely and easily From  Standing Position, Turn to Look Behind Over each Shoulder: Looks behind from both sides and weight shifts well Turn 360 Degrees: Able to turn 360 degrees safely in 4 seconds or less Standing Unsupported, One Foot in Front: Able to place foot tandem independently and hold 30 seconds Standing on One Leg: Able to lift leg independently and hold 5-10 seconds         Pertinent Vitals/Pain Pain Assessment: No/denies pain    Home Living Family/patient expects to be discharged to:: Private residence Living Arrangements: Spouse/significant other                    Prior Function Level of Independence: Independent               Hand Dominance        Extremity/Trunk Assessment               Lower Extremity Assessment: Overall WFL for tasks assessed         Communication   Communication: No difficulties  Cognition Arousal/Alertness: Awake/alert Behavior During Therapy: WFL for tasks assessed/performed Overall Cognitive Status: Within Functional Limits for tasks assessed                      General Comments General comments (skin integrity, edema, etc.): Reviewed stroke prevention and warning signs with patient.  Exercises        Assessment/Plan    PT Assessment Patent does not need any further PT services  PT Diagnosis Abnormality of gait   PT Problem List    PT Treatment Interventions     PT Goals (Current goals can be found in the Care Plan section) Acute Rehab PT Goals PT Goal Formulation: All assessment and education complete, DC therapy    Frequency     Barriers to discharge        Co-evaluation               End of Session   Activity Tolerance: Patient tolerated treatment well Patient left: with call bell/phone within reach;in chair      Functional Assessment Tool Used: Clinical Judgement Functional Limitation: Self care Self Care Current Status (Z6109): At least 1 percent but less than 20 percent impaired, limited  or restricted Self Care Goal Status (U0454): 0 percent impaired, limited or restricted Self Care Discharge Status [phone]): 0 percent impaired, limited or restricted    Time: 0940-0950 PT Time Calculation (min) (ACUTE ONLY): 10 min   Charges:   PT Evaluation $Initial PT Evaluation Tier I: 1 Procedure     PT G Codes:   PT G-Codes **NOT FOR INPATIENT CLASS** Functional Assessment Tool Used: Clinical Judgement Functional Limitation: Self care Self Care Current Status (B1478): At least 1 percent but less than 20 percent impaired, limited or restricted Self Care Goal Status (G9562): 0 percent impaired, limited or restricted Self Care Discharge Status [phone]): 0 percent impaired, limited or restricted    [facility] 12/30/2014, 10:31 AM  Sheran Lawless, PT [phone] 12/30/2014

## 2014-12-30 NOTE — Progress Notes (Signed)
Pt discharged at this time with his wife. Neuro intact. Alert,  verbal with no noted distress. He denies pain. IV discontinued, dry dressing applied. Discharge instructions provided with prescriptions with verbal understanding. Pt has appt. with his PCP at 2pm. Made aware to follow up with Neurology per discharge instructions. Pt and wife took all personal belongings home. No assistive device needed or HH services.

## 2014-12-30 NOTE — Progress Notes (Signed)
Pt requested contact MD or on call, to obtain information about discharge. On call responded within timely manner and advised that they could not find any information in any of MD notes indicating potential discharge this evening and advised Pt could sign out AMA if desired. Did explain information to Pt who was accepting and had no intention of leaving AMA.

## 2014-12-30 NOTE — Progress Notes (Signed)
Subjective: patient has no complaints, walking in the hall.   Objective: Current vital signs: BP 118/76 mmHg  Pulse 53  Temp(Src) 97.5 F (36.4 C) (Oral)  Resp 20  Ht  (1.753 m)  Wt 82.056 kg (180 lb 14.4 oz)  BMI 26.70 kg/m2  SpO2 98% Vital signs in last 24 hours: Temp:  [97.5 F (36.4 C)-98.4 F (36.9 C)] 97.5 F (36.4 C) (04/01 0700) Pulse Rate:  [52-55] 53 (04/01 0700) Resp:  [16-20] 20 (04/01 0700) BP: (113-140)/(68-78) 118/76 mmHg (04/01 0700) SpO2:  [98 %-100 %] 98 % (04/01 0700)  Intake/Output from previous day:   Intake/Output this shift:   Nutritional status: Diet regular Room service appropriate?: Yes; Fluid consistency:: Thin  Neurologic Exam:  Mental Status: Alert, oriented, thought content appropriate.  Speech fluent without evidence of aphasia.  Able to follow 3 step commands without difficulty. Cranial Nerves: II:  Visual fields grossly normal, pupils equal, round, reactive to light and accommodation III,IV, VI: ptosis not present, extra-ocular motions intact bilaterally V,VII: smile symmetric, facial light touch sensation normal bilaterally VIII: hearing normal bilaterally IX,X: uvula rises symmetrically XI: bilateral shoulder shrug XII: midline tongue extension without atrophy or fasciculations  Motor: Right : Upper extremity   5/5    Left:     Upper extremity   5/5  Lower extremity   5/5     Lower extremity   5/5 Tone and bulk:normal tone throughout; no atrophy noted Sensory: Pinprick and light touch intact throughout, bilaterally Deep Tendon Reflexes:  Right: Upper Extremity   Left: Upper extremity   biceps (C-5 to C-6) 2/4   biceps (C-5 to C-6) 2/4 tricep (C7) 2/4    triceps (C7) 2/4 Brachioradialis (C6) 2/4  Brachioradialis (C6) 2/4  Lower Extremity Lower Extremity  quadriceps (L-2 to L-4) 2/4   quadriceps (L-2 to L-4) 2/4 Achilles (S1) 2/4   Achilles (S1) 2/4  Plantars: Right: downgoing   Left: downgoing Cerebellar: normal  finger-to-nose,  normal heel-to-shin test Gait: normal    Lab Results: Basic Metabolic Panel:  Recent Labs Lab 12/28/14 1636 12/28/14 1704 12/28/14 1739 12/29/14 0659  NA 140 140 141 140  K 4.2 4.1 4.2 4.4  CL 105 104 104 108  CO2 25  --   --  28  GLUCOSE 73 67* 64* 92  BUN CREATININE 1.20 1.10 1.20 1.21  CALCIUM 9.8  --   --  9.4    Liver Function Tests:  Recent Labs Lab 12/28/14 1636 12/29/14 0659  AST 25 21  ALT 20 18  ALKPHOS 72 65  BILITOT 0.9 0.8  PROT 6.6 6.3  ALBUMIN 4.2 3.8   No results for input(s): LIPASE, AMYLASE in the last 168 hours. No results for input(s): AMMONIA in the last 168 hours.  CBC:  Recent Labs Lab 12/28/14 1636 12/28/14 1704 12/28/14 1739 12/29/14 0659  WBC 5.3  --   --  4.9  NEUTROABS 3.4  --   --  2.9  HGB 16.0 16.0 16.3 16.0  HCT 45.1 47.0 48.0 44.7  MCV 85.1  --   --  85.0  PLT 185  --   --  166    Cardiac Enzymes: No results for input(s): CKTOTAL, CKMB, CKMBINDEX, TROPONINI in the last 168 hours.  Lipid Panel:  Recent Labs Lab 12/29/14 0659  CHOL 179  TRIG 73  HDL 46  CHOLHDL 3.9  VLDL 15  LDLCALC 161*    CBG:  Recent Labs Lab  12/28/14 1648 12/28/14 2248 12/29/14 0640  GLUCAP 79 81 96    Microbiology: Results for orders placed or performed during the hospital encounter of 07/28/12  Surgical pcr screen     Status: None   Collection Time: 07/28/12  9:25 AM  Result Value Ref Range Status   MRSA, PCR NEGATIVE NEGATIVE Final   Staphylococcus aureus NEGATIVE NEGATIVE Final    Comment:        The Xpert SA Assay (FDA approved for NASAL specimens in patients over 74 years of age), is one component of a comprehensive surveillance program.  Test performance has been validated by [employer] for patients greater than or equal to 42 year old. It is not intended to diagnose infection nor to guide or monitor treatment.    Coagulation Studies:  Recent Labs  12/28/14 1636  LABPROT  14.4  INR 1.11    Imaging: Ct Angio Head W/cm &/or Wo Cm  12/28/2014   CLINICAL DATA:  Expressive aphasia with headache beginning earlier today. Minimal neurologic symptoms currently. Initial encounter.  EXAM: CT ANGIOGRAPHY HEAD rib  TECHNIQUE: Multidetector CT imaging of the head was performed using the standard protocol during bolus administration of intravenous contrast. Multiplanar CT image reconstructions and MIPs were obtained to evaluate the vascular anatomy.  CONTRAST:  80mL OMNIPAQUE IOHEXOL 350 MG/ML SOLN  COMPARISON:  Noncontrast CT head earlier today.  FINDINGS: CT HEAD  Calvarium and skull base: No fracture or destructive lesion. Mastoids and middle ears are grossly clear.  Paranasal sinuses: Imaged portions are clear.  Orbits: Negative.  Brain: No evidence of acute abnormality, including acute infarct, hemorrhage, hydrocephalus, or mass lesion.  CTA NECK  Aortic arch: Standard branching. Imaged portion shows no evidence of aneurysm or dissection. No significant stenosis of the major arch vessel origins.  Right carotid system: No evidence of dissection, stenosis (50% or greater) or occlusion. In frontal  Left carotid system: No evidence of dissection, stenosis (50% or greater) or occlusion.  Vertebral arteries: RIGHT vertebral dominant. No evidence of dissection, stenosis (50% or greater) or occlusion.  Nonvascular soft tissues: Cervical spondylosis. No lung apex lesion. No neck masses.  CTA HEAD  Anterior circulation: No significant stenosis, proximal occlusion, aneurysm, or vascular malformation.  Posterior circulation: No significant stenosis, proximal occlusion, aneurysm, or vascular malformation.  Venous sinuses: As permitted by contrast timing, patent.  Anatomic variants: None of significance.  Delayed phase:   No abnormal intracranial enhancement.  IMPRESSION: No extracranial or intracranial flow reducing lesion or dissection is observed. Specifically, no evidence for dissection.    Electronically Signed   By: Davonna Belling M.D.   On: 12/28/2014 18:05   Ct Head (brain) Wo Contrast  12/28/2014   CLINICAL DATA:  Code stroke. Headache. Double vision. Difficulty speaking at times.  EXAM: CT HEAD WITHOUT CONTRAST  TECHNIQUE: Contiguous axial images were obtained from the base of the skull through the vertex without intravenous contrast.  COMPARISON:  None.  FINDINGS: No mass lesion. No midline shift. No acute hemorrhage or hematoma. No extra-axial fluid collections. No evidence of acute infarction. There is slight prominence of the lateral ventricles. Brain parenchyma appears normal. No osseous abnormality.  IMPRESSION: No significant abnormality. Critical Value/emergent results were called by telephone at the time of interpretation on 12/28/2014 at 4:51 pm to Dr. Thad Ranger, who verbally acknowledged these results.   Electronically Signed   By: Francene Boyers M.D.   On: 12/28/2014 16:51   Ct Angio Neck W/cm &/or Wo/cm  12/28/2014  CLINICAL DATA:  Expressive aphasia with headache beginning earlier today. Minimal neurologic symptoms currently. Initial encounter.  EXAM: CT ANGIOGRAPHY HEAD rib  TECHNIQUE: Multidetector CT imaging of the head was performed using the standard protocol during bolus administration of intravenous contrast. Multiplanar CT image reconstructions and MIPs were obtained to evaluate the vascular anatomy.  CONTRAST:  80mL OMNIPAQUE IOHEXOL 350 MG/ML SOLN  COMPARISON:  Noncontrast CT head earlier today.  FINDINGS: CT HEAD  Calvarium and skull base: No fracture or destructive lesion. Mastoids and middle ears are grossly clear.  Paranasal sinuses: Imaged portions are clear.  Orbits: Negative.  Brain: No evidence of acute abnormality, including acute infarct, hemorrhage, hydrocephalus, or mass lesion.  CTA NECK  Aortic arch: Standard branching. Imaged portion shows no evidence of aneurysm or dissection. No significant stenosis of the major arch vessel origins.  Right carotid  system: No evidence of dissection, stenosis (50% or greater) or occlusion. In frontal  Left carotid system: No evidence of dissection, stenosis (50% or greater) or occlusion.  Vertebral arteries: RIGHT vertebral dominant. No evidence of dissection, stenosis (50% or greater) or occlusion.  Nonvascular soft tissues: Cervical spondylosis. No lung apex lesion. No neck masses.  CTA HEAD  Anterior circulation: No significant stenosis, proximal occlusion, aneurysm, or vascular malformation.  Posterior circulation: No significant stenosis, proximal occlusion, aneurysm, or vascular malformation.  Venous sinuses: As permitted by contrast timing, patent.  Anatomic variants: None of significance.  Delayed phase:   No abnormal intracranial enhancement.  IMPRESSION: No extracranial or intracranial flow reducing lesion or dissection is observed. Specifically, no evidence for dissection.   Electronically Signed   By: Davonna BellingJohn  Curnes M.D.   On: 12/28/2014 18:05   Mri Brain Without Contrast  12/29/2014   CLINICAL DATA:  Word-finding difficulties, decreased RIGHT face sensation. Evaluate aphasia. History of prostatitis and recent headaches.  EXAM: MRI HEAD WITHOUT CONTRAST  TECHNIQUE: Multiplanar, multiecho pulse sequences of the brain and surrounding structures were obtained without intravenous contrast.  COMPARISON:  CT of the head December 28, 2014 and CT angiogram of the head December 28, 2014 at 1731 hours  FINDINGS: No reduced diffusion to suggest acute ischemia. No susceptibility artifact to suggest hemorrhage. Ventricles and sulci are normal for patient's age. No abnormal parenchymal signal, mass lesions, mass effect. No abnormal extra-axial fluid collections. Normal major intracranial vascular flow voids seen at the skull base.  Ocular globes and orbital contents are unremarkable. Trace paranasal sinus mucosal thickening without air-fluid levels. The mastoid air cells are well aerated. No abnormal sellar expansion. No cerebellar  tonsillar ectopia. No suspicious calvarial bone marrow signal.  IMPRESSION: No acute ischemia ; normal noncontrast MRI of the brain for age.   Electronically Signed   By: Awilda Metroourtnay  Bloomer   On: 12/29/2014 00:05    Medications:  Scheduled: . aspirin  325 mg Oral Daily  . enoxaparin (LOVENOX) injection  40 mg Subcutaneous Q24H  . rosuvastatin  5 mg Oral q1800   Felicie MornDavid Smith PA-C [facility] [phone]  12/30/2014, 9:18 AM  Patient seen and examined.  Clinical course and management discussed.  Necessary edits performed.  I agree with the above.  Assessment and plan of care developed and discussed below.    Assessment/Plan: 74 YO male presenting to ED with intermittent word finding difficulty and HA. He has had no further word finding difficulty overnight. Stroke work up has been unremarkable.  A1c 5.1. Echo WNL.   Recommendations: 1.  Continue ASA daily 2.  No further neurologic intervention  is recommended at this time.  If further questions arise, please call or page at that time.  Thank you for allowing neurology to participate in the care of this patient.  Patient to follow up with neurology as an outpatient.     Thana Farr, MD [facility] [phone] 12/30/2014  10:58 AM

## 2015-01-01 NOTE — Discharge Summary (Addendum)
Physician Discharge Summary  Brandon Stein WUJ:[id number] DOB: 10/26/1940 DOA: 12/28/2014  PCP: Brandon Blamer, MD  Admit date: 12/28/2014 Discharge date: 12/30/2014  Time spent: 30 minutes  Recommendations for Outpatient Follow-up:  1. Follow up with neurology as recommended.  2. Follow up with PCP in one week.   Discharge Diagnoses:  Principal Problem:   TIA (transient ischemic attack) Active Problems:   Aphasia   Prostatitis, chronic   Fungal toenail infection   Discharge Condition: improved.   Diet recommendation: regular diet.   Filed Weights   12/28/14 1656 12/28/14 2051  Weight: 79.379 kg (175 lb) 82.056 kg (180 lb 14.4 oz)    History of present illness:  74 year old male admitted for slurred speech. TIA work up ordered.   Hospital Course:  slurrred speech on the day of admission. CT head and MRI brain negative for stroke. Carotid duplex does not show significant stenosis.  Echocardiogram showed LVEF of 55 to 60%, no regional wall abnormalities. LV diastolic function are normal. Resume aspirin. LDL is 118. Started the patient on statin on discharge.    Procedures:  MRI brain  CT head without contrast  Echo  Carotid duplex.   Consultations:  Neurology.   Discharge Exam: Filed Vitals:   12/30/14 0920  BP: 115/66  Pulse: 57  Temp: 97.8 F (36.6 C)  Resp: 18    General: alert afebrile comfortable Cardiovascular: s1s2 Respiratory:ctab  Discharge Instructions   Discharge Instructions    Discharge instructions    Complete by:  As directed   Follow up with Neurology as recommended.  Please check liver function panel in 4 weeks.          Discharge Medication List as of 12/30/2014 10:45 AM    START taking these medications   Details  aspirin 325 MG tablet Take 1 tablet (325 mg total) by mouth daily., Starting 12/30/2014, Until Discontinued, Print    rosuvastatin (CRESTOR) 5 MG tablet Take 1 tablet (5 mg total) by mouth daily at 6 PM.,  Starting 12/30/2014, Until Discontinued, Print      CONTINUE these medications which have NOT CHANGED   Details  ciprofloxacin (CIPRO) 500 MG tablet Take 500 mg by mouth 2 (two) times daily., Until Discontinued, Historical Med    fluconazole (DIFLUCAN) 200 MG tablet Take 200 mg by mouth once a week. , Starting 11/24/2014, Until Discontinued, Historical Med      STOP taking these medications     ibuprofen (ADVIL,MOTRIN) 200 MG tablet        Allergies  Allergen Reactions  . Cefaclor     REACTION: Hives   Follow-up Information    Follow up with Brandon Blamer, MD. Schedule an appointment as soon as possible for a visit in 1 week.   Specialty:  Family Medicine   Contact information:   [phone] [address] [place] [address] [phone]       Follow up with Stein,[name], MD. Schedule an appointment as soon as possible for a visit in 2 months.   Specialty:  Neurology   Contact information:   [address] [place] [address] [phone]        The results of significant diagnostics from this hospitalization (including imaging, microbiology, ancillary and laboratory) are listed below for reference.    Significant Diagnostic Studies: Ct Angio Head W/cm &/or Wo Cm  12/28/2014   CLINICAL DATA:  Expressive aphasia with headache beginning earlier today. Minimal neurologic symptoms currently. Initial encounter.  EXAM: CT ANGIOGRAPHY HEAD rib  TECHNIQUE: Multidetector CT imaging of the head was performed using the standard protocol during bolus administration of intravenous contrast. Multiplanar CT image reconstructions and MIPs were obtained to evaluate the vascular anatomy.  CONTRAST:  80mL OMNIPAQUE IOHEXOL 350 MG/ML SOLN  COMPARISON:  Noncontrast CT head earlier today.  FINDINGS: CT HEAD  Calvarium and skull base: No fracture or destructive lesion. Mastoids and middle ears are grossly clear.  Paranasal sinuses: Imaged portions are clear.  Orbits:  Negative.  Brain: No evidence of acute abnormality, including acute infarct, hemorrhage, hydrocephalus, or mass lesion.  CTA NECK  Aortic arch: Standard branching. Imaged portion shows no evidence of aneurysm or dissection. No significant stenosis of the major arch vessel origins.  Right carotid system: No evidence of dissection, stenosis (50% or greater) or occlusion. In frontal  Left carotid system: No evidence of dissection, stenosis (50% or greater) or occlusion.  Vertebral arteries: RIGHT vertebral dominant. No evidence of dissection, stenosis (50% or greater) or occlusion.  Nonvascular soft tissues: Cervical spondylosis. No lung apex lesion. No neck masses.  CTA HEAD  Anterior circulation: No significant stenosis, proximal occlusion, aneurysm, or vascular malformation.  Posterior circulation: No significant stenosis, proximal occlusion, aneurysm, or vascular malformation.  Venous sinuses: As permitted by contrast timing, patent.  Anatomic variants: None of significance.  Delayed phase:   No abnormal intracranial enhancement.  IMPRESSION: No extracranial or intracranial flow reducing lesion or dissection is observed. Specifically, no evidence for dissection.   Electronically Signed   By: Davonna Belling M.D.   On: 12/28/2014 18:05   Ct Head (brain) Wo Contrast  12/28/2014   CLINICAL DATA:  Code stroke. Headache. Double vision. Difficulty speaking at times.  EXAM: CT HEAD WITHOUT CONTRAST  TECHNIQUE: Contiguous axial images were obtained from the base of the skull through the vertex without intravenous contrast.  COMPARISON:  None.  FINDINGS: No mass lesion. No midline shift. No acute hemorrhage or hematoma. No extra-axial fluid collections. No evidence of acute infarction. There is slight prominence of the lateral ventricles. Brain parenchyma appears normal. No osseous abnormality.  IMPRESSION: No significant abnormality. Critical Value/emergent results were called by telephone at the time of interpretation on  12/28/2014 at 4:51 pm to Dr. Thad Ranger, who verbally acknowledged these results.   Electronically Signed   By: Francene Boyers M.D.   On: 12/28/2014 16:51   Ct Angio Neck W/cm &/or Wo/cm  12/28/2014   CLINICAL DATA:  Expressive aphasia with headache beginning earlier today. Minimal neurologic symptoms currently. Initial encounter.  EXAM: CT ANGIOGRAPHY HEAD rib  TECHNIQUE: Multidetector CT imaging of the head was performed using the standard protocol during bolus administration of intravenous contrast. Multiplanar CT image reconstructions and MIPs were obtained to evaluate the vascular anatomy.  CONTRAST:  80mL OMNIPAQUE IOHEXOL 350 MG/ML SOLN  COMPARISON:  Noncontrast CT head earlier today.  FINDINGS: CT HEAD  Calvarium and skull base: No fracture or destructive lesion. Mastoids and middle ears are grossly clear.  Paranasal sinuses: Imaged portions are clear.  Orbits: Negative.  Brain: No evidence of acute abnormality, including acute infarct, hemorrhage, hydrocephalus, or mass lesion.  CTA NECK  Aortic arch: Standard branching. Imaged portion shows no evidence of aneurysm or dissection. No significant stenosis of the major arch vessel origins.  Right carotid system: No evidence of dissection, stenosis (50% or greater) or occlusion. In frontal  Left carotid system: No evidence of dissection, stenosis (50% or greater) or occlusion.  Vertebral arteries: RIGHT vertebral dominant.  No evidence of dissection, stenosis (50% or greater) or occlusion.  Nonvascular soft tissues: Cervical spondylosis. No lung apex lesion. No neck masses.  CTA HEAD  Anterior circulation: No significant stenosis, proximal occlusion, aneurysm, or vascular malformation.  Posterior circulation: No significant stenosis, proximal occlusion, aneurysm, or vascular malformation.  Venous sinuses: As permitted by contrast timing, patent.  Anatomic variants: None of significance.  Delayed phase:   No abnormal intracranial enhancement.  IMPRESSION: No  extracranial or intracranial flow reducing lesion or dissection is observed. Specifically, no evidence for dissection.   Electronically Signed   By: Davonna BellingJohn  Curnes M.D.   On: 12/28/2014 18:05   Mri Brain Without Contrast  12/29/2014   CLINICAL DATA:  Word-finding difficulties, decreased RIGHT face sensation. Evaluate aphasia. History of prostatitis and recent headaches.  EXAM: MRI HEAD WITHOUT CONTRAST  TECHNIQUE: Multiplanar, multiecho pulse sequences of the brain and surrounding structures were obtained without intravenous contrast.  COMPARISON:  CT of the head December 28, 2014 and CT angiogram of the head December 28, 2014 at 1731 hours  FINDINGS: No reduced diffusion to suggest acute ischemia. No susceptibility artifact to suggest hemorrhage. Ventricles and sulci are normal for patient's age. No abnormal parenchymal signal, mass lesions, mass effect. No abnormal extra-axial fluid collections. Normal major intracranial vascular flow voids seen at the skull base.  Ocular globes and orbital contents are unremarkable. Trace paranasal sinus mucosal thickening without air-fluid levels. The mastoid air cells are well aerated. No abnormal sellar expansion. No cerebellar tonsillar ectopia. No suspicious calvarial bone marrow signal.  IMPRESSION: No acute ischemia ; normal noncontrast MRI of the brain for age.   Electronically Signed   By: Awilda Metroourtnay  Bloomer   On: 12/29/2014 00:05    Microbiology: No results found for this or any previous visit (from the past 240 hour(s)).   Labs: Basic Metabolic Panel:  Recent Labs Lab 12/28/14 1636 12/28/14 1704 12/28/14 1739 12/29/14 0659  NA 140 140 141 140  K 4.2 4.1 4.2 4.4  CL 105 104 104 108  CO2 25  --   --  28  GLUCOSE 73 67* 64* 92  BUN 14 19 19 14   CREATININE 1.20 1.10 1.20 1.21  CALCIUM 9.8  --   --  9.4   Liver Function Tests:  Recent Labs Lab 12/28/14 1636 12/29/14 0659  AST 25 21  ALT 20 18  ALKPHOS 72 65  BILITOT 0.9 0.8  PROT 6.6 6.3   ALBUMIN 4.2 3.8   No results for input(s): LIPASE, AMYLASE in the last 168 hours. No results for input(s): AMMONIA in the last 168 hours. CBC:  Recent Labs Lab 12/28/14 1636 12/28/14 1704 12/28/14 1739 12/29/14 0659  WBC 5.3  --   --  4.9  NEUTROABS 3.4  --   --  2.9  HGB 16.0 16.0 16.3 16.0  HCT 45.1 47.0 48.0 44.7  MCV 85.1  --   --  85.0  PLT 185  --   --  166   Cardiac Enzymes: No results for input(s): CKTOTAL, CKMB, CKMBINDEX, TROPONINI in the last 168 hours. BNP: BNP (last 3 results) No results for input(s): BNP in the last 8760 hours.  ProBNP (last 3 results) No results for input(s): PROBNP in the last 8760 hours.  CBG:  Recent Labs Lab 12/28/14 1648 12/28/14 2248 12/29/14 0640  GLUCAP 79 81 96       Signed:  Adelyn Roscher  [facility] 01/01/2015, 11:23 PM

## 2015-01-30 DIAGNOSIS — R42 Dizziness and giddiness: Secondary | ICD-10-CM

## 2015-01-30 HISTORY — DX: Dizziness and giddiness: R42

## 2015-01-31 ENCOUNTER — Encounter: Payer: Self-pay | Admitting: Neurology

## 2015-01-31 ENCOUNTER — Ambulatory Visit (INDEPENDENT_AMBULATORY_CARE_PROVIDER_SITE_OTHER): Payer: Medicare Other | Admitting: Neurology

## 2015-01-31 VITALS — BP 129/72 | HR 54 | Ht 68.5 in | Wt 181.4 lb

## 2015-01-31 DIAGNOSIS — R002 Palpitations: Secondary | ICD-10-CM | POA: Diagnosis not present

## 2015-01-31 DIAGNOSIS — G451 Carotid artery syndrome (hemispheric): Secondary | ICD-10-CM

## 2015-01-31 DIAGNOSIS — H8110 Benign paroxysmal vertigo, unspecified ear: Secondary | ICD-10-CM | POA: Diagnosis not present

## 2015-01-31 DIAGNOSIS — E785 Hyperlipidemia, unspecified: Secondary | ICD-10-CM | POA: Diagnosis not present

## 2015-01-31 NOTE — Progress Notes (Signed)
NEUROLOGY CLINIC NEW PATIENT NOTE  NAME: Brandon Stein DOB: 09/13/1941 REFERRING PHYSICIAN: [name], MD  I saw Brandon Stein as a new consult in the neurovascular clinic today regarding  Chief Complaint  Patient presents with  . Transient Ischemic Attack    rm 2  . Headache  .  HPI: Brandon Stein is a 74 y.o. male with PMH of BPH who presents as a new patient for TIA.   He was signing paperwork on 12/28/14 in notarization office when he started to have episode of difficulty to get words out and also paraphasic errors when he did get words out. He went back home and wife also felt the same thing and he came to [facility] ER. He also felt some numbness of right cheek in ER. The whole episode lasted about and resolved. He was admitted and had stroke work up with MRI, CTA head and neck, CUS as well as 2D echo all negative. A1C normal but LDL 118. He was put on ASA  and lipitor and was discharged home. Since then, he was doing well at home without recurrent symptoms.   He does have sinus headache since Nov last year. He stated that he really feels throbbing pain at bi-frontal and nasal area in the morning when get up, take ibuprofen and he is good for the day. No photophobia but mild phonophobia, no N/V. This happens 4-5 time a week until the above episode. Since the episode, he has had no headache and facial pain yet.   He had BPPV 10 years ago and yesterday he had it again when he lay down for his weight lifting exercise. It lasted several minutes with vertigo but then resolved. He was taught maneuvers in the past but can not remember much now but he refused to be referred to PT for maneuver training this time.  His FH include mom died of ICH at age 24, she also had CAD s/p CABG. His uncle and grandma also had heart disease.   He denies any smoking, alcohol or illicit drugs. 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 He is on healthy diet now and he has some intentional weight loss.   Past Medical History  Diagnosis Date  . PONV (postoperative nausea and vomiting)   . TIA (transient ischemic attack)   . Ringing in ears   . Dizziness 01/30/15    with n/v x 1   Past Surgical History  Procedure Laterality Date  . Right shoulder surgery   2011  . Back surgery  1999    L4-5   . Knee surgery  1979    bilateral   . Transurethral resection of prostate  07/31/2012    Procedure: TRANSURETHRAL RESECTION OF THE PROSTATE WITH GYRUS INSTRUMENTS;  Surgeon: Antony Haste, MD;  Location: [facility];  Service: Urology;  Laterality: N/A;   Family History  Problem Relation Age of Onset  . Kidney disease Mother   . Diabetes Mother   . Coronary artery disease Mother   . Hypertension Mother   . COPD Father    Current Outpatient Prescriptions  Medication Sig Dispense Refill  . aspirin 325 MG tablet Take 1 tablet (325 mg total) by mouth daily. 30 tablet 1  . co-enzyme Q-10 30 MG capsule Take 30 mg by mouth 3 (three) times daily.    . fluconazole (DIFLUCAN) 200 MG  tablet Take 200 mg by mouth once a week.     . Omega-3 Fatty Acids (FISH OIL CONCENTRATE PO) Take by mouth.    . rosuvastatin (CRESTOR) 5 MG tablet Take 1 tablet (5 mg total) by mouth daily at 6 PM. 30 tablet 0   No current facility-administered medications for this visit.   Allergies  Allergen Reactions  . Cefaclor     REACTION: Hives   History   Social History  . Marital Status: Married    Spouse Name: N/A  . Number of Children: 2  . Years of Education: 14   Occupational History  . retired     Social research officer, government   Social History Main Topics  . Smoking status: Former Smoker    Quit date: 01/31/1967  . Smokeless tobacco: Never Used  . Alcohol Use: Yes     Comment: occasional wine   . Drug Use: No  . Sexual Activity: Not on file   Other Topics Concern  . Not on file   Social History Narrative     Married, retired Forensic scientist, exercises   Right handed   Caffeine use - 2 1/2 cups coffee daily    Review of Systems Full 14 system review of systems performed and notable only for those listed, all others are neg:  Constitutional:   Cardiovascular:  Ear/Nose/Throat:  Ringing in ears, spinning sensation Skin:  Eyes:   Respiratory:   Gastroitestinal:   Genitourinary:  Hematology/Lymphatic:  Easy bleeding Endocrine:  Musculoskeletal:   Allergy/Immunology:   Neurological:  dizziness Psychiatric:  Sleep: snoring   Physical Exam  Filed Vitals:   01/31/15 1341  BP: 129/72  Pulse: 54    General - Well nourished, well developed, in no apparent distress.  Ophthalmologic - Sharp disc margins OU.  Cardiovascular - Regular rate and rhythm with no murmur. Carotid pulses were 2+ without bruits .   Neck - supple, no nuchal rigidity .  Mental Status -  Level of arousal and orientation to time, place, and person were intact. Language including expression, naming, repetition, comprehension, reading, and writing was assessed and found intact. Attention span and concentration were normal. Recent and remote memory were intact. Fund of Knowledge was assessed and was intact.  Cranial Nerves II - XII - II - Visual field intact OU. III, IV, VI - Extraocular movements intact. V - Facial sensation intact bilaterally. VII - Facial movement intact bilaterally. VIII - Hearing & vestibular intact bilaterally. X - Palate elevates symmetrically. XI - Chin turning & shoulder shrug intact bilaterally. XII - Tongue protrusion intact.  Motor Strength - The patient's strength was normal in all extremities and pronator drift was absent.  Bulk was normal and fasciculations were absent.   Motor Tone - Muscle tone was assessed at the neck and appendages and was normal.  Reflexes - The patient's reflexes were normal in all extremities and he had no pathological reflexes.  Sensory - Light  touch, temperature/pinprick, vibration and proprioception, and Romberg testing were assessed and were normal.    Coordination - The patient had normal movements in the hands and feet with no ataxia or dysmetria.  Tremor was absent.  Gait and Station - The patient's transfers, posture, gait, station, and turns were observed as normal.  Piedad Climes - Hallpike test negative.   Imaging  I have personally reviewed the radiological images below and agree with the radiology interpretations.  MRI - No acute ischemia ; normal noncontrast MRI of the brain for age.  CTA head and neck - No extracranial or intracranial flow reducing lesion or dissection is observed. Specifically, no evidence for dissection.  2D echo - - Left ventricle: The cavity size was normal. Systolic function was normal. The estimated ejection fraction was in the range of 55% to 60%. Wall motion was normal; there were no regional wall motion abnormalities. Left ventricular diastolic function parameters were normal. - Aortic valve: There was mild to moderate regurgitation directed centrally in the LVOT. - Mitral valve: There was mild regurgitation. - Left atrium: The atrium was mildly dilated. - Atrial septum: No defect or patent foramen ovale was identified. - Pulmonary arteries: PA peak pressure: 33 mm Hg (S).  CUS - Bilateral - 1% to 39% ICA stenosis. Vertebral artery flow is antegrade.  Lab Review Component     Latest Ref Rng 12/29/2014  Cholesterol     0 - 200 mg/dL 161  Triglycerides     <150 mg/dL 73  HDL Cholesterol     >39 mg/dL 46  Total CHOL/HDL Ratio      3.9  VLDL     0 - 40 mg/dL 15  LDL (calc)     0 - 99 mg/dL 096 (H)  Hemoglobin E4V     4.8 - 5.6 % 5.1  Mean Plasma Glucose      100    Assessments: NIHSS: 0 Rankin: 0   Assessment and Plan:   In summary, SANTEZ WOODCOX is a 74 y.o. male with PMH of BPH was admitted on 12/28/14 for transient episode of word finding difficulty and  paraphasic errors. Stroke work up all negative except LDL 118. He was put on ASA and lipitor. No recurrent symptoms. He does have sinus headache and pattern also indicating sinusitis, but recently no headache issues. DDx for episode including TIA, seizure, complicated migraine or anxiety/panic attack, however low suspicious for seizure or complicated migraine or panic attack. Due to episodic aphasia and pt complain of occasional palpitation, will do 30 day cardiac event monitoring. Will also do EEG although low suspicious for seizure.  - Continue ASA and crestor for stroke prevention - cardiac event monitoring for 30 days to rule out afib - EEG to rule out seizure, but low suspicious - recommend to do maneuver training at home for BPPV - Follow up with your primary care physician for stroke risk factor modification. Recommend maintain blood pressure goal <130/80, diabetes with hemoglobin A1c goal below 6.5% and lipids with LDL cholesterol goal below 70 mg/dL.  - RTC in 2 months.  I recommend aggressive blood pressure control with a goal <130/80 mm Hg.  Lipids should be managed intensively, with a goal LDL < 70 mg/dL.  I encouraged the patient to discuss these important issues with his primary care physician.  I counseled the patient on measures to reduce stroke risk, including the importance of medication compliance, risk factor control, exercise, healthy diet, and avoidance of smoking.  I reviewed stroke warning signs and symptoms and appropriate actions to take if such occurs.   Thank you very much for the opportunity to participate in the care of this patient.  Please do not hesitate to call if any questions or concerns arise.  Orders Placed This Encounter  Procedures  . Cardiac event monitor    Standing Status: Future     Number of Occurrences:      Standing Expiration Date: 02/01/2016    Scheduling Instructions:     Request cardionet setup. Thank you.  Order Specific Question:  Where should  this test be performed    Answer:  [facility]  . EEG adult    Standing Status: Future     Number of Occurrences:      Standing Expiration Date: 01/31/2016    Order Specific Question:  Where should this test be performed?    Answer:  [facility]    Meds ordered this encounter  Medications  . Omega-3 Fatty Acids (FISH OIL CONCENTRATE PO)    Sig: Take by mouth.  . co-enzyme Q-10 30 MG capsule    Sig: Take 30 mg by mouth 3 (three) times daily.    Patient Instructions  - Continue ASA and crestor for stroke prevention - cardiac event monitoring for 30 days to rule out irregular heart beat - EEG to rule out seizure, but low suspicious - recommend to do maneuver exercise at home for BPPV - Follow up with your primary care physician for stroke risk factor modification. Recommend maintain blood pressure goal <130/80, diabetes with hemoglobin A1c goal below 6.5% and lipids with LDL cholesterol goal below 70 mg/dL.  - follow up in 2 months.   Marvel PlanJindong Bassam Dresch, MD PhD [facility] [address], [address] [place], [place] [id number] [phone]

## 2015-01-31 NOTE — Patient Instructions (Signed)
-   Continue ASA and crestor for stroke prevention - cardiac event monitoring for 30 days to rule out irregular heart beat - EEG to rule out seizure, but low suspicious - recommend to do maneuver exercise at home for BPPV - Follow up with your primary care physician for stroke risk factor modification. Recommend maintain blood pressure goal <130/80, diabetes with hemoglobin A1c goal below 6.5% and lipids with LDL cholesterol goal below 70 mg/dL.  - follow up in 2 months.

## 2015-02-09 ENCOUNTER — Encounter: Payer: Self-pay | Admitting: *Deleted

## 2015-02-09 ENCOUNTER — Ambulatory Visit (INDEPENDENT_AMBULATORY_CARE_PROVIDER_SITE_OTHER): Payer: Medicare Other

## 2015-02-09 ENCOUNTER — Other Ambulatory Visit: Payer: Self-pay | Admitting: Neurology

## 2015-02-09 DIAGNOSIS — R002 Palpitations: Secondary | ICD-10-CM

## 2015-02-09 DIAGNOSIS — G459 Transient cerebral ischemic attack, unspecified: Secondary | ICD-10-CM

## 2015-02-09 DIAGNOSIS — I4891 Unspecified atrial fibrillation: Secondary | ICD-10-CM

## 2015-02-09 NOTE — Progress Notes (Signed)
Patient ID: Everlean AlstromCraig W Bartok, male   DOB: 07/11/1941, 74 y.o.   MRN: [id number] [employer] 30 day cardiac event monitor applied to patient.

## 2015-02-16 ENCOUNTER — Ambulatory Visit (INDEPENDENT_AMBULATORY_CARE_PROVIDER_SITE_OTHER): Payer: Medicare Other | Admitting: Neurology

## 2015-02-16 DIAGNOSIS — G451 Carotid artery syndrome (hemispheric): Secondary | ICD-10-CM

## 2015-02-16 NOTE — Procedures (Signed)
   HISTORY:  TECHNIQUE:  16 channel EEG was performed based on standard 10-16 international system. One channel was dedicated to EKG, which has demonstrates sinus rhythm of 48 beats per minutes.  Upon awakening, the posterior background activity was well-developed, in alpha range, 9-10Hz , reactive to eye opening and closure.  There was no evidence of epileptiform discharge.  Photic stimulation was performed, which induced a symmetric photic driving.  Hyperventilation was not performed    Stage II sleep was achieved, as evident by Vtex waves and sleep spindles. CONCLUSION: This is a  Normal awake and asleep EEG.  There is no electrodiagnostic evidence of epileptiform discharge

## 2015-02-17 ENCOUNTER — Telehealth: Payer: Self-pay | Admitting: *Deleted

## 2015-02-17 NOTE — Telephone Encounter (Signed)
Spoke with patient and relayed results of normal EEG results, per Dr Roda ShuttersXu. Patient verbalized understanding, appreciation.

## 2015-02-17 NOTE — Telephone Encounter (Signed)
-----   Message from Lilla ShookMichelle C Kirkman, RN sent at 02/16/2015  5:57 PM EDT -----   ----- Message -----    From: Marvel PlanJindong Xu, MD    Sent: 02/16/2015   5:57 PM      To: [facility] T[facility]  Hello, could you please let patient know that his EEG was normal done today in our office. No change of treatment plan. Thank you.  Marvel PlanJindong Xu, MD PhD Stroke Neurology 02/16/2015 5:57 PM

## 2015-03-27 ENCOUNTER — Other Ambulatory Visit: Payer: Self-pay

## 2015-04-06 ENCOUNTER — Telehealth: Payer: Self-pay

## 2015-04-06 NOTE — Telephone Encounter (Signed)
LVM to inform pt that Cardiac Event Monitoring did not show any arrhythmias. Patient was instructed to call office back to receive results

## 2015-04-07 NOTE — Telephone Encounter (Signed)
Tried calling patient again this morning. No answer. No messge left.

## 2015-04-24 ENCOUNTER — Ambulatory Visit (INDEPENDENT_AMBULATORY_CARE_PROVIDER_SITE_OTHER): Payer: Medicare Other | Admitting: Neurology

## 2015-04-24 ENCOUNTER — Encounter: Payer: Self-pay | Admitting: Neurology

## 2015-04-24 VITALS — BP 138/80 | HR 49 | Ht 68.5 in | Wt 173.4 lb

## 2015-04-24 DIAGNOSIS — G43109 Migraine with aura, not intractable, without status migrainosus: Secondary | ICD-10-CM

## 2015-04-24 DIAGNOSIS — G451 Carotid artery syndrome (hemispheric): Secondary | ICD-10-CM | POA: Diagnosis not present

## 2015-04-24 DIAGNOSIS — H8110 Benign paroxysmal vertigo, unspecified ear: Secondary | ICD-10-CM

## 2015-04-24 DIAGNOSIS — G43909 Migraine, unspecified, not intractable, without status migrainosus: Secondary | ICD-10-CM | POA: Diagnosis not present

## 2015-04-24 DIAGNOSIS — E785 Hyperlipidemia, unspecified: Secondary | ICD-10-CM | POA: Diagnosis not present

## 2015-04-24 NOTE — Patient Instructions (Signed)
-   continue ASA and crestor for stroke prevention - check BP at home - Follow up with your primary care physician for stroke risk factor modification. Recommend maintain blood pressure goal <140/80, diabetes with hemoglobin A1c goal below 6.5% and lipids with LDL cholesterol goal below 70 mg/dL.  - health diet and regular exercise - follow up in 6 months

## 2015-04-25 DIAGNOSIS — G43109 Migraine with aura, not intractable, without status migrainosus: Secondary | ICD-10-CM | POA: Insufficient documentation

## 2015-04-25 NOTE — Progress Notes (Signed)
STROKE NEUROLOGY FOLLOW UP NOTE  NAME: Brandon Stein DOB: 03-06-1941  REASON FOR VISIT: stroke follow up HISTORY FROM: pt and chart  Today we had the pleasure of seeing Brandon Stein in follow-up at our Neurology Clinic. Pt was accompanied by no one.   History Summary  Brandon Stein is a 74 y.o. male with PMH of BPH who presents for TIA.   He was signing paperwork on 12/28/14 in notarization office when he started to have episode of difficulty to get words out and also paraphasic errors when he did get words out. He went back home and wife also felt the same thing and he came to [facility] ER. He also felt some numbness of right cheek in ER. The whole episode lasted about and resolved. He was admitted and had stroke work up with MRI, CTA head and neck, CUS as well as 2D echo all negative. A1C normal but LDL 118. He was put on ASA  and lipitor and was discharged home. Since then, he was doing well at home without recurrent symptoms.   He does have sinus headache since Nov last year. He stated that he really feels throbbing pain at bi-frontal and nasal area in the morning when get up, take ibuprofen and he is good for the day. No photophobia but mild phonophobia, no N/V. This happens 4-5 time a week until the above episode. Since the episode, he has had no headache and facial pain yet.   He had BPPV 10 years ago and yesterday he had it again when he lay down for his weight lifting exercise. It lasted several minutes with vertigo but then resolved. He was taught maneuvers in the past but can not remember much now but he refused to be referred to PT for maneuver training this time.  His FH include mom died of ICH at age 32, she also had CAD s/p CABG. His uncle and grandma also had heart disease.   He denies any smoking, alcohol or illicit drugs. 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 He is on healthy  diet now and he has some intentional weight loss.   Interval History During the interval time, the patient has been doing well.  No recurrent symptoms. He did admit that he has history of HA in the past but he thought it was sinus HA and he did have some HA recently but not more than once a week. He has no other complains. BP 138/80. EEG and 30 day cardiac event monitoring were negative.     HPI:  Past Medical History  Diagnosis Date  . PONV (postoperative nausea and vomiting)   . TIA (transient ischemic attack)   . Ringing in ears   . Dizziness 01/30/15    with n/v x 1   Past Surgical History  Procedure Laterality Date  . Right shoulder surgery   2011  . Back surgery  1999    L4-5   . Knee surgery  1979    bilateral   . Transurethral resection of prostate  07/31/2012    Procedure: TRANSURETHRAL RESECTION OF THE PROSTATE WITH GYRUS INSTRUMENTS;  Surgeon: Antony Haste, MD;  Location: [facility];  Service: Urology;  Laterality: N/A;   Family History  Problem Relation Age of Onset  . Kidney disease Mother   . Diabetes Mother   . Coronary artery disease Mother   .  Hypertension Mother   . COPD Father    Current Outpatient Prescriptions  Medication Sig Dispense Refill  . aspirin 325 MG tablet Take 1 tablet (325 mg total) by mouth daily. 30 tablet 1  . co-enzyme Q-10 30 MG capsule Take 30 mg by mouth 3 (three) times daily.    . fluconazole (DIFLUCAN) 200 MG tablet Take 200 mg by mouth once a week.     . Omega-3 Fatty Acids (FISH OIL CONCENTRATE PO) Take by mouth.    . rosuvastatin (CRESTOR) 5 MG tablet Take 1 tablet (5 mg total) by mouth daily at 6 PM. (Patient not taking: Reported on 04/24/2015) 30 tablet 0   No current facility-administered medications for this visit.   Allergies  Allergen Reactions  . Ceclor [Cefaclor]     REACTION: Hives   History   Social History  . Marital Status: Married    Spouse Name: N/A  . Number of Children: 2  . Years of Education: 14     Occupational History  . retired     Social research officer, government   Social History Main Topics  . Smoking status: Former Smoker    Quit date: 01/31/1967  . Smokeless tobacco: Never Used  . Alcohol Use: Yes     Comment: occasional wine   . Drug Use: No  . Sexual Activity: Not on file   Other Topics Concern  . Not on file   Social History Narrative   Married, retired Forensic scientist, exercises   Right handed   Caffeine use - 2 1/2 cups coffee daily    Review of Systems Full 14 system review of systems performed and notable only for those listed, all others are neg:  Constitutional:   Cardiovascular:  Ear/Nose/Throat:   Skin:  Eyes:   Respiratory:   Gastroitestinal:   Genitourinary:  Hematology/Lymphatic:   Endocrine:  Musculoskeletal:   Allergy/Immunology:   Neurological:   Psychiatric:  Sleep: snoring   Physical Exam  Filed Vitals:   04/24/15 1610  BP: 138/80  Pulse: 49    General - Well nourished, well developed, in no apparent distress.  Ophthalmologic - Sharp disc margins OU.  Cardiovascular - Regular rate and rhythm with no murmur. Carotid pulses were 2+ without bruits .   Neck - supple, no nuchal rigidity .  Mental Status -  Level of arousal and orientation to time, place, and person were intact. Language including expression, naming, repetition, comprehension, reading, and writing was assessed and found intact. Attention span and concentration were normal. Recent and remote memory were intact. Fund of Knowledge was assessed and was intact.  Cranial Nerves II - XII - II - Visual field intact OU. III, IV, VI - Extraocular movements intact. V - Facial sensation intact bilaterally. VII - Facial movement intact bilaterally. VIII - Hearing & vestibular intact bilaterally. X - Palate elevates symmetrically. XI - Chin turning & shoulder shrug intact bilaterally. XII - Tongue protrusion intact.  Motor Strength - The patient's strength was  normal in all extremities and pronator drift was absent.  Bulk was normal and fasciculations were absent.   Motor Tone - Muscle tone was assessed at the neck and appendages and was normal.  Reflexes - The patient's reflexes were normal in all extremities and he had no pathological reflexes.  Sensory - Light touch, temperature/pinprick, vibration and proprioception, and Romberg testing were assessed and were normal.    Coordination - The patient had normal movements in the hands and feet with no ataxia  or dysmetria.  Tremor was absent.  Gait and Station - The patient's transfers, posture, gait, station, and turns were observed as normal.  Brandon Stein - Hallpike test negative.   Imaging  I have personally reviewed the radiological images below and agree with the radiology interpretations.  MRI - No acute ischemia ; normal noncontrast MRI of the brain for age.  CTA head and neck - No extracranial or intracranial flow reducing lesion or dissection is observed. Specifically, no evidence for dissection.  2D echo - - Left ventricle: The cavity size was normal. Systolic function was normal. The estimated ejection fraction was in the range of 55% to 60%. Wall motion was normal; there were no regional wall motion abnormalities. Left ventricular diastolic function parameters were normal. - Aortic valve: There was mild to moderate regurgitation directed centrally in the LVOT. - Mitral valve: There was mild regurgitation. - Left atrium: The atrium was mildly dilated. - Atrial septum: No defect or patent foramen ovale was identified. - Pulmonary arteries: PA peak pressure: 33 mm Hg (S).  CUS - Bilateral - 1% to 39% ICA stenosis. Vertebral artery flow is Antegrade.  EEG - normal EEG  30 day cardiac event monitoring - no afib episodes.  Lab Review Component     Latest Ref Rng 12/29/2014  Cholesterol     0 - 200 mg/dL 161  Triglycerides     <150 mg/dL 73  HDL Cholesterol     >39  mg/dL 46  Total CHOL/HDL Ratio      3.9  VLDL     0 - 40 mg/dL 15  LDL (calc)     0 - 99 mg/dL 096 (H)  Hemoglobin E4V     4.8 - 5.6 % 5.1  Mean Plasma Glucose      100    Assessments: NIHSS: 0 Rankin: 0   Assessment and Plan:   In summary, Brandon Stein is a 74 y.o. male with PMH of BPH was admitted on 12/28/14 for transient episode of word finding difficulty and paraphasic errors. Stroke work up including MRI, CTA head and neck, 2D echo, CUS, EEG and 30 day cardiac monitoring all negative except LDL 118. He was put on ASA and lipitor. No recurrent symptoms. He does have "sinus headache" but recently no significant headache issues. DDx for episode including TIA, and complicated migraine.  - continue ASA and crestor for stroke prevention - check BP at home - Follow up with your primary care physician for stroke risk factor modification. Recommend maintain blood pressure goal <140/80, diabetes with hemoglobin A1c goal below 6.5% and lipids with LDL cholesterol goal below 70 mg/dL.  - health diet and regular exercise - RTC in 6 months  No orders of the defined types were placed in this encounter.    No orders of the defined types were placed in this encounter.    Patient Instructions  - continue ASA and crestor for stroke prevention - check BP at home - Follow up with your primary care physician for stroke risk factor modification. Recommend maintain blood pressure goal <140/80, diabetes with hemoglobin A1c goal below 6.5% and lipids with LDL cholesterol goal below 70 mg/dL.  - health diet and regular exercise - follow up in 6 months    Marvel Plan, MD PhD [facility] [address] [place], [place] [address] [phone]

## 2015-06-22 ENCOUNTER — Encounter: Payer: Self-pay | Admitting: Gastroenterology

## 2015-10-19 ENCOUNTER — Other Ambulatory Visit: Payer: Self-pay | Admitting: Family Medicine

## 2015-10-19 DIAGNOSIS — Z136 Encounter for screening for cardiovascular disorders: Secondary | ICD-10-CM

## 2015-10-20 ENCOUNTER — Encounter: Payer: Self-pay | Admitting: Nurse Practitioner

## 2015-10-20 ENCOUNTER — Ambulatory Visit (INDEPENDENT_AMBULATORY_CARE_PROVIDER_SITE_OTHER): Payer: Medicare Other | Admitting: Nurse Practitioner

## 2015-10-20 VITALS — BP 135/72 | HR 52 | Ht 68.5 in | Wt 183.4 lb

## 2015-10-20 DIAGNOSIS — E785 Hyperlipidemia, unspecified: Secondary | ICD-10-CM | POA: Diagnosis not present

## 2015-10-20 DIAGNOSIS — H8110 Benign paroxysmal vertigo, unspecified ear: Secondary | ICD-10-CM

## 2015-10-20 DIAGNOSIS — G451 Carotid artery syndrome (hemispheric): Secondary | ICD-10-CM

## 2015-10-20 NOTE — Patient Instructions (Addendum)
Continue ASA and crestor for stroke prevention Keep B/P systolic less than 140 todays reading 135/72 Follow up with your primary care physician for stroke risk factor modification.  lipids with LDL cholesterol goal below 70 mg/dL.  Continue healthy diet and regular exercise Follow up 6 months if stable will discharge

## 2015-10-20 NOTE — Progress Notes (Addendum)
[facility]  PATIENT: Brandon Stein DOB: 1940/11/02   REASON FOR VISIT: Follow-up for TIA  HISTORY FROM: Patient    HISTORY OF PRESENT ILLNESS: HISTORY:Brandon Stein is a 75 y.o. male with PMH of BPH who presents for TIA.  He was signing paperwork on 12/28/14 in notarization office when he started to have episode of difficulty to get words out and also paraphasic errors when he did get words out. He went back home and wife also felt the same thing and he came to [facility] ER. He also felt some numbness of right cheek in ER. The whole episode lasted about and resolved. He was admitted and had stroke work up with MRI, CTA head and neck, CUS as well as 2D echo all negative. A1C normal but LDL 118. He was put on ASA  and lipitor and was discharged home. Since then, he was doing well at home without recurrent symptoms.   He does have sinus headache since Nov last year. He stated that he really feels throbbing pain at bi-frontal and nasal area in the morning when get up, take ibuprofen and he is good for the day. No photophobia but mild phonophobia, no N/V. This happens 4-5 time a week until the above episode. Since the episode, he has had no headache and facial pain yet.  He had BPPV 10 years ago and yesterday he had it again when he lay down for his weight lifting exercise. It lasted several minutes with vertigo but then resolved. He was taught maneuvers in the past but can not remember much now but he refused to be referred to PT for maneuver training this time. His FH include mom died of ICH at age 50, she also had CAD s/p CABG. His uncle and grandma also had heart disease.  He denies any smoking, alcohol or illicit drugs. 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 He is on healthy diet now and he has some intentional weight loss.   Interval HistoryJXu 04/24/15 During the interval  time, the patient has been doing well. No recurrent symptoms. He did admit that he has history of HA in the past but he thought it was sinus HA and he did have some HA recently but not more than once a week. He has no other complains. BP 138/80. EEG and 30 day cardiac event monitoring were negative.  UPDATE 10/20/15 Brandon Stein, 75 year old male returns for follow-up. He has a history of TIA on 12/28/2014.Stroke work up with MRI, CTA head and neck, CUS as well as 2D echo all negative. A1C normal but LDL 118. He was put on ASA  and lipitor . He has a history of vertigo and also migraine but no symptoms recently. Blood pressure is well controlled. He continues to exercise by bicycling and lifting weights. He has  healthy diet. He has not had further stroke or TIA symptoms since December 28, 2014  admission. He returns for reevaluation  REVIEW OF SYSTEMS: Full 14 system review of systems performed and notable only for those listed, all others are neg:  Constitutional: neg  Cardiovascular: neg Ear/Nose/Throat: neg  Skin: neg Eyes: neg Respiratory: neg Gastroitestinal: neg  Hematology/Lymphatic: neg  Endocrine: neg Musculoskeletal:neg Allergy/Immunology: neg Neurological: neg Psychiatric: neg Sleep : neg   ALLERGIES: Allergies  Allergen Reactions  . Ceclor [Cefaclor]     REACTION: Hives    HOME  MEDICATIONS: Outpatient Prescriptions Prior to Visit  Medication Sig Dispense Refill  . aspirin 325 MG tablet Take 1 tablet (325 mg total) by mouth daily. 30 tablet 1  . co-enzyme Q-10 30 MG capsule Take 30 mg by mouth 3 (three) times daily.    . Omega-3 Fatty Acids (FISH OIL CONCENTRATE PO) Take by mouth.    . rosuvastatin (CRESTOR) 5 MG tablet Take 1 tablet (5 mg total) by mouth daily at 6 PM. 30 tablet 0  . fluconazole (DIFLUCAN) 200 MG tablet Take 200 mg by mouth once a week. Reported on 10/20/2015     No facility-administered medications prior to visit.    PAST MEDICAL HISTORY: Past  Medical History  Diagnosis Date  . PONV (postoperative nausea and vomiting)   . TIA (transient ischemic attack)   . Ringing in ears   . Dizziness 01/30/15    with n/v x 1    PAST SURGICAL HISTORY: Past Surgical History  Procedure Laterality Date  . Right shoulder surgery   2011  . Back surgery  1999    L4-5   . Knee surgery  1979    bilateral   . Transurethral resection of prostate  07/31/2012    Procedure: TRANSURETHRAL RESECTION OF THE PROSTATE WITH GYRUS INSTRUMENTS;  Surgeon: Antony Haste, MD;  Location: [facility];  Service: Urology;  Laterality: N/A;    FAMILY HISTORY: Family History  Problem Relation Age of Onset  . Kidney disease Mother   . Diabetes Mother   . Coronary artery disease Mother   . Hypertension Mother   . COPD Father     SOCIAL HISTORY: Social History   Social History  . Marital Status: Married    Spouse Name: N/A  . Number of Children: 2  . Years of Education: 14   Occupational History  . retired     Social research officer, government   Social History Main Topics  . Smoking status: Former Smoker    Quit date: 01/31/1967  . Smokeless tobacco: Never Used  . Alcohol Use: Yes     Comment: occasional wine   . Drug Use: No  . Sexual Activity: Not on file   Other Topics Concern  . Not on file   Social History Narrative   Married, retired Forensic scientist, exercises   Right handed   Caffeine use - 2 1/2 cups coffee daily     PHYSICAL EXAM  Filed Vitals:   10/20/15 1006  BP: 135/72  Pulse: 52  Height: 5' 8.5" (1.74 m)  Weight: 183 lb 6.4 oz (83.19 kg)   Body mass index is 27.48 kg/(m^2). General - Well nourished, well developed, in no apparent distress. Cardiovascular - Regular rate and rhythm with no murmur.   Neck - supple, no carotid bruits  Mental Status -  Level of arousal and orientation to time, place, and person were intact. Language including expression, naming, repetition, comprehension, reading, and writing was  assessed and found intact.Attention span and concentration were normal.Recent and remote memory were intact.Fund of Knowledge was assessed and was intact.  Cranial Nerves II - XII - II - Visual field intact OU.Sharp disc margins OU. III, IV, VI - Extraocular movements intact. V - Facial sensation intact bilaterally. VII - Facial movement intact bilaterally. VIII - Hearing & vestibular intact bilaterally. X - Palate elevates symmetrically. XI - Chin turning & shoulder shrug intact bilaterally. XII - Tongue protrusion intact.  Motor Strength - The patient's strength was normal in all extremities and  pronator drift was absent. Bulk and tone are normal  Reflexes - The patient's reflexes were normal in all extremities and he had no pathological reflexes. Sensory - Light touch, temperature/pinprick, vibration and proprioception, and Romberg testing were assessed and were normal.  Coordination - The patient had normal movements in the hands and feet with no ataxia or dysmetria. Tremor was absent. Gait and Station - The patient's transfers, posture, gait, station, and turns were observed as normal.   DIAGNOSTIC DATA (LABS, IMAGING, TESTING) - I reviewed patient records, labs, notes, testing and imaging myself where available.  Lab Results  Component Value Date   WBC 4.9 12/29/2014   HGB 16.0 12/29/2014   HCT 44.7 12/29/2014   MCV 85.0 12/29/2014   PLT 166 12/29/2014      Component Value Date/Time   NA 140 12/29/2014 0659   K 4.4 12/29/2014 0659   CL 108 12/29/2014 0659   CO2 28 12/29/2014 0659   GLUCOSE 92 12/29/2014 0659   BUN 14 12/29/2014 0659   CREATININE 1.21 12/29/2014 0659   CALCIUM 9.4 12/29/2014 0659   PROT 6.3 12/29/2014 0659   ALBUMIN 3.8 12/29/2014 0659   AST 21 12/29/2014 0659   ALT 18 12/29/2014 0659   ALKPHOS 65 12/29/2014 0659   BILITOT 0.8 12/29/2014 0659   GFRNONAA 58* 12/29/2014 0659   GFRAA 68* 12/29/2014 0659   Lab Results  Component Value Date    CHOL 179 12/29/2014   HDL 46 12/29/2014   LDLCALC 118* 12/29/2014   TRIG 73 12/29/2014   CHOLHDL 3.9 12/29/2014   Lab Results  Component Value Date   HGBA1C 5.1 12/29/2014    ASSESSMENT AND PLAN 75 y.o. male with PMH of BPH was admitted on 12/28/14 for transient episode of word finding difficulty and paraphasic errors. Stroke work up including MRI, CTA head and neck, 2D echo, CUS, EEG and 30 day cardiac monitoring all negative except LDL 118. He was put on ASA and lipitor. No recurrent symptoms. He does have history of headache and vertigo but none recently. He has not had further stroke or TIA symptoms. Hospital records reviewed The patient is a current patient of Dr. Roda Shutters  who is out of the office today . This note is sent to the work in doctor.      PLAN:  Continue ASA and crestor for stroke prevention Keep B/P systolic less than 140 todays reading 135/72 Follow up with your primary care physician for stroke risk factor modification.  lipids with LDL cholesterol goal below 70 mg/dL.  Continue healthy diet and regular exercise Follow up 6 months if stable will discharge Vst time 25 min Nilda Riggs, [facility], [facility], APRN  [facility] [address] [place], [place] [address] [phone]   Discussed patient with Nilda Riggs NP and agree with assessment and plan Richard A. Epimenio Foot, MD, PhD Certified in Neurology, Clinical Neurophysiology, Sleep Medicine, Pain Medicine and Neuroimaging  [facility] [address], [address] [place], [place] [address] [phone]

## 2015-10-25 ENCOUNTER — Ambulatory Visit: Payer: Medicare Other | Admitting: Neurology

## 2015-11-10 ENCOUNTER — Ambulatory Visit
Admission: RE | Admit: 2015-11-10 | Discharge: 2015-11-10 | Disposition: A | Discharge status: Home / Self Care | Payer: Medicare Other | Source: Ambulatory Visit | Attending: Family Medicine | Admitting: Family Medicine

## 2015-11-10 DIAGNOSIS — Z136 Encounter for screening for cardiovascular disorders: Secondary | ICD-10-CM

## 2016-04-19 ENCOUNTER — Ambulatory Visit (INDEPENDENT_AMBULATORY_CARE_PROVIDER_SITE_OTHER): Payer: Medicare Other | Admitting: Nurse Practitioner

## 2016-04-19 ENCOUNTER — Encounter: Payer: Self-pay | Admitting: Nurse Practitioner

## 2016-04-19 VITALS — BP 136/73 | HR 47 | Ht 68.5 in | Wt 175.6 lb

## 2016-04-19 DIAGNOSIS — H8110 Benign paroxysmal vertigo, unspecified ear: Secondary | ICD-10-CM | POA: Diagnosis not present

## 2016-04-19 DIAGNOSIS — G43909 Migraine, unspecified, not intractable, without status migrainosus: Secondary | ICD-10-CM

## 2016-04-19 DIAGNOSIS — G451 Carotid artery syndrome (hemispheric): Secondary | ICD-10-CM | POA: Diagnosis not present

## 2016-04-19 DIAGNOSIS — G43109 Migraine with aura, not intractable, without status migrainosus: Secondary | ICD-10-CM

## 2016-04-19 NOTE — Progress Notes (Signed)
I have read the note, and I agree with the clinical assessment and plan.  Maesyn Frisinger A. Andrea Colglazier, MD, PhD Certified in Neurology, Clinical Neurophysiology, Sleep Medicine, Pain Medicine and Neuroimaging  [facility] [address], [address] [place], [place] [address] [phone]  

## 2016-04-19 NOTE — Progress Notes (Signed)
[facility]  PATIENT: Brandon Stein DOB: 05/12/1941   REASON FOR VISIT: Follow-up for TIA, headaches, benign paroxysmal positional vertigo HISTORY FROM: Patient    HISTORY OF PRESENT ILLNESS:HISTORY:JXuCraig Fabian SharpW Schappert is a 75 y.o. male with PMH of BPH who presents for TIA.  He was signing paperwork on 12/28/14 in notarization office when he started to have episode of difficulty to get words out and also paraphasic errors when he did get words out. He went back home and wife also felt the same thing and he came [facility]. He also felt some numbness of right cheek in ER. The whole episode lasted about 30min and resolved. He was admitted and had stroke work up with MRI, CTA head and neck, CUS as well as 2D echo all negative. A1C normal but LDL 118. He was put on ASA 325mg  and lipitor and was discharged home. Since then, he was doing well at home without recurrent symptoms.   He does have sinus headache since Nov last year. He stated that he really feels throbbing pain at bi-frontal and nasal area in the morning when get up, take ibuprofen and he is good for the day. No photophobia but mild phonophobia, no N/V. This happens 4-5 time a week until the above episode. Since the episode, he has had no headache and facial pain yet.  He had BPPV 10 years ago and yesterday he had it again when he lay down for his weight lifting exercise. It lasted several minutes with vertigo but then resolved. He was taught maneuvers in the past but can not remember much now but he refused to be referred to PT for maneuver training this time. His FH include mom died of ICH at age 73, she also had CAD s/p CABG. His uncle and grandma also had heart disease.  He denies any smoking, alcohol or illicit drugs. 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 He is on healthy diet now and he has some intentional weight loss.    Interval HistoryJXu 04/24/15 During the interval time, the patient has been doing well. No recurrent symptoms. He did admit that he has history of HA in the past but he thought it was sinus HA and he did have some HA recently but not more than once a week. He has no other complains. BP 138/80. EEG and 30 day cardiac event monitoring were negative.  UPDATE 10/20/15 Brandon Stein, 75 year old male returns for follow-up. He has a history of TIA on 12/28/2014.Stroke work up with MRI, CTA head and neck, CUS as well as 2D echo all negative. A1C normal but LDL 118. He was put on ASA 325mg  and lipitor . He has a history of vertigo and also migraine but no symptoms recently. Blood pressure is well controlled. He continues to exercise by bicycling and lifting weights. He has healthy diet. He has not had further stroke or TIA symptoms since December 28, 2014 admission. He returns for reevaluation UPDATE 07/21/2017CM. Brandon Stein, 75 year old male returns for follow-up. He has history of TIA 12/28/2014. Stroke workup with MRI CTA of the head and neck carotid ultrasound and 2-D echo all negative. He has not had further stroke or TIA symptoms he remains on aspirin and was recently changed to Crestor. He continues to exercise by bicycling and lifting weights. He also has a history of headaches which are sinus related and benign paroxysmal  positional vertigo which is in good control He returns for reevaluation  REVIEW OF SYSTEMS: Full 14 system review of systems performed and notable only for those listed, all others are neg:  Constitutional: neg  Cardiovascular: neg Ear/Nose/Throat: neg  Skin: neg Eyes: neg Respiratory: neg Gastroitestinal: neg  Hematology/Lymphatic: neg  Endocrine: neg Musculoskeletal:neg Allergy/Immunology: neg Neurological: neg Psychiatric: neg Sleep : neg   ALLERGIES: Allergies  Allergen Reactions  . Ceclor [Cefaclor]     REACTION: Hives    HOME MEDICATIONS: Outpatient  Prescriptions Prior to Visit  Medication Sig Dispense Refill  . aspirin 325 MG tablet Take 1 tablet (325 mg total) by mouth daily. 30 tablet 1  . co-enzyme Q-10 30 MG capsule Take 30 mg by mouth 2 (two) times daily.     . Multiple Vitamin (MULTIVITAMIN) tablet Take 1 tablet by mouth daily.    . Omega-3 Fatty Acids (FISH OIL CONCENTRATE PO) Take by mouth.    . rosuvastatin (CRESTOR) 5 MG tablet Take 1 tablet (5 mg total) by mouth daily at 6 PM. 30 tablet 0   No facility-administered medications prior to visit.    PAST MEDICAL HISTORY: Past Medical History  Diagnosis Date  . PONV (postoperative nausea and vomiting)   . TIA (transient ischemic attack)   . Ringing in ears   . Dizziness 01/30/15    with n/v x 1    PAST SURGICAL HISTORY: Past Surgical History  Procedure Laterality Date  . Right shoulder surgery   2011  . Back surgery  1999    L4-5   . Knee surgery  1979    bilateral   . Transurethral resection of prostate  07/31/2012    Procedure: TRANSURETHRAL RESECTION OF THE PROSTATE WITH GYRUS INSTRUMENTS;  Surgeon: Antony Haste, MD;  Location: [facility];  Service: Urology;  Laterality: N/A;    FAMILY HISTORY: Family History  Problem Relation Age of Onset  . Kidney disease Mother   . Diabetes Mother   . Coronary artery disease Mother   . Hypertension Mother   . COPD Father     SOCIAL HISTORY: Social History   Social History  . Marital Status: Married    Spouse Name: N/A  . Number of Children: 2  . Years of Education: 14   Occupational History  . retired     Social research officer, government   Social History Main Topics  . Smoking status: Former Smoker    Quit date: 01/31/1967  . Smokeless tobacco: Never Used  . Alcohol Use: Yes     Comment: occasional wine   . Drug Use: No  . Sexual Activity: Not on file   Other Topics Concern  . Not on file   Social History Narrative   Married, retired Forensic scientist, exercises   Right handed   Caffeine use - 2  1/2 cups coffee daily     PHYSICAL EXAM  Filed Vitals:   04/19/16 0757  BP: 136/73  Pulse: 47  Height: 5' 8.5" (1.74 m)  Weight: 175 lb 9.6 oz (79.652 kg)   Body mass index is 26.31 kg/(m^2). General - Well nourished, well developed, in no apparent distress. Cardiovascular - Regular rate and rhythm with no murmur.  Neck - supple, no carotid bruits  Mental Status -  Level of arousal and orientation to time, place, and person were intact. Language including expression, naming, repetition, comprehension, reading, and writing was assessed and found intact.Attention span and concentration were normal.Recent and remote memory were intact.Fund of  Knowledge was assessed and was intact.  Cranial Nerves II - XII - II - Visual field intact OU.Stein disc margins OU. III, IV, VI - Extraocular movements intact. V - Facial sensation intact bilaterally. VII - Facial movement intact bilaterally. VIII - Hearing & vestibular intact bilaterally. X - Palate elevates symmetrically. XI - Chin turning & shoulder shrug intact bilaterally. XII - Tongue protrusion intact.  Motor Strength - The patient's strength was normal in all extremities and pronator drift was absent. Bulk and tone are normal  Reflexes - The patient's reflexes were normal in all extremities and he had no pathological reflexes. Sensory - Light touch, temperature/pinprick, vibration and proprioception, and Romberg testing were assessed and were normal.  Coordination - The patient had normal movements in the hands and feet with no ataxia or dysmetria. Tremor was absent. Gait and Station - The patient's transfers, posture, gait, station, and turns were observed as normal. DIAGNOSTIC DATA (LABS, IMAGING, TESTING) -  ASSESSMENT AND PLAN 75 y.o. male with PMH of BPH was admitted on 12/28/14 for transient episode of word finding difficulty and paraphasic errors. Stroke work up including MRI, CTA head and neck, 2D echo, CUS, EEG and 30  day cardiac monitoring all negative except LDL 118. He was put on ASA and lipitor. Marland Kitchen He does have history of headache and vertigo but none recently. He has not had further stroke or TIA symptoms. The patient is a current patient of Dr. Roda Shutters who is out of the office today . This note is sent to the work in doctor.   PLAN:  Keep systolic blood pressure less than 130, today's reading 136/73 Lipids are followed by PCP continue Crestor Continue Aspirin for secondary stroke prevention No further stroke or TIA symptoms since March 2016 If recurrent stroke symptoms occur, call 911 and proceed to the hospital Discharge from neurologic services at this time Nilda Riggs, [facility], [facility], APRN  [facility] [address], [address] [place], [place] [address] [phone]

## 2016-04-19 NOTE — Patient Instructions (Addendum)
  Keep systolic blood pressure less than 130, today's reading 136/73 Lipids are followed by PCP continue Crestor Continue Aspirin for secondary stroke prevention No further stroke or TIA symptoms since March 2016 If recurrent stroke symptoms occur, call 911 and proceed to the hospital Discharge from neurologic services at this time

## 2016-09-18 DIAGNOSIS — M1712 Unilateral primary osteoarthritis, left knee: Secondary | ICD-10-CM | POA: Diagnosis not present

## 2016-09-18 DIAGNOSIS — M25562 Pain in left knee: Secondary | ICD-10-CM | POA: Diagnosis not present

## 2016-10-03 DIAGNOSIS — Z125 Encounter for screening for malignant neoplasm of prostate: Secondary | ICD-10-CM | POA: Diagnosis not present

## 2016-10-03 DIAGNOSIS — N400 Enlarged prostate without lower urinary tract symptoms: Secondary | ICD-10-CM | POA: Diagnosis not present

## 2016-10-03 DIAGNOSIS — E785 Hyperlipidemia, unspecified: Secondary | ICD-10-CM | POA: Diagnosis not present

## 2016-10-03 DIAGNOSIS — G459 Transient cerebral ischemic attack, unspecified: Secondary | ICD-10-CM | POA: Diagnosis not present

## 2016-10-03 DIAGNOSIS — M1712 Unilateral primary osteoarthritis, left knee: Secondary | ICD-10-CM | POA: Diagnosis not present

## 2016-10-22 DIAGNOSIS — L821 Other seborrheic keratosis: Secondary | ICD-10-CM | POA: Diagnosis not present

## 2016-10-22 DIAGNOSIS — D1801 Hemangioma of skin and subcutaneous tissue: Secondary | ICD-10-CM | POA: Diagnosis not present

## 2016-10-22 DIAGNOSIS — L812 Freckles: Secondary | ICD-10-CM | POA: Diagnosis not present

## 2016-10-22 DIAGNOSIS — L570 Actinic keratosis: Secondary | ICD-10-CM | POA: Diagnosis not present

## 2016-10-22 DIAGNOSIS — Z8673 Personal history of transient ischemic attack (TIA), and cerebral infarction without residual deficits: Secondary | ICD-10-CM | POA: Diagnosis not present

## 2016-10-22 DIAGNOSIS — Z7982 Long term (current) use of aspirin: Secondary | ICD-10-CM | POA: Diagnosis not present

## 2016-10-22 DIAGNOSIS — M1712 Unilateral primary osteoarthritis, left knee: Secondary | ICD-10-CM | POA: Diagnosis not present

## 2016-10-22 DIAGNOSIS — M25562 Pain in left knee: Secondary | ICD-10-CM | POA: Diagnosis not present

## 2016-10-22 DIAGNOSIS — E7800 Pure hypercholesterolemia, unspecified: Secondary | ICD-10-CM | POA: Diagnosis not present

## 2016-10-22 DIAGNOSIS — Z87891 Personal history of nicotine dependence: Secondary | ICD-10-CM | POA: Diagnosis not present

## 2016-10-22 DIAGNOSIS — G8918 Other acute postprocedural pain: Secondary | ICD-10-CM | POA: Diagnosis not present

## 2016-10-25 DIAGNOSIS — L812 Freckles: Secondary | ICD-10-CM | POA: Diagnosis not present

## 2016-10-25 DIAGNOSIS — D1801 Hemangioma of skin and subcutaneous tissue: Secondary | ICD-10-CM | POA: Diagnosis not present

## 2016-10-25 DIAGNOSIS — L821 Other seborrheic keratosis: Secondary | ICD-10-CM | POA: Diagnosis not present

## 2016-10-25 DIAGNOSIS — L570 Actinic keratosis: Secondary | ICD-10-CM | POA: Diagnosis not present

## 2016-10-29 DIAGNOSIS — M25562 Pain in left knee: Secondary | ICD-10-CM | POA: Diagnosis not present

## 2016-10-29 DIAGNOSIS — M1712 Unilateral primary osteoarthritis, left knee: Secondary | ICD-10-CM | POA: Diagnosis not present

## 2016-10-29 DIAGNOSIS — G8918 Other acute postprocedural pain: Secondary | ICD-10-CM | POA: Diagnosis not present

## 2016-10-31 DIAGNOSIS — R066 Hiccough: Secondary | ICD-10-CM | POA: Diagnosis not present

## 2016-10-31 DIAGNOSIS — T402X1A Poisoning by other opioids, accidental (unintentional), initial encounter: Secondary | ICD-10-CM | POA: Diagnosis not present

## 2016-10-31 DIAGNOSIS — Z96652 Presence of left artificial knee joint: Secondary | ICD-10-CM | POA: Diagnosis not present

## 2016-10-31 DIAGNOSIS — Z79899 Other long term (current) drug therapy: Secondary | ICD-10-CM | POA: Diagnosis not present

## 2016-10-31 DIAGNOSIS — R4182 Altered mental status, unspecified: Secondary | ICD-10-CM | POA: Diagnosis not present

## 2016-10-31 DIAGNOSIS — T40601A Poisoning by unspecified narcotics, accidental (unintentional), initial encounter: Secondary | ICD-10-CM | POA: Diagnosis not present

## 2016-10-31 DIAGNOSIS — Z8673 Personal history of transient ischemic attack (TIA), and cerebral infarction without residual deficits: Secondary | ICD-10-CM | POA: Diagnosis not present

## 2016-10-31 DIAGNOSIS — M1712 Unilateral primary osteoarthritis, left knee: Secondary | ICD-10-CM | POA: Diagnosis not present

## 2016-11-05 DIAGNOSIS — Z96652 Presence of left artificial knee joint: Secondary | ICD-10-CM | POA: Diagnosis not present

## 2016-11-05 DIAGNOSIS — Z5189 Encounter for other specified aftercare: Secondary | ICD-10-CM | POA: Diagnosis not present

## 2016-11-08 DIAGNOSIS — Z5189 Encounter for other specified aftercare: Secondary | ICD-10-CM | POA: Diagnosis not present

## 2016-11-08 DIAGNOSIS — Z96652 Presence of left artificial knee joint: Secondary | ICD-10-CM | POA: Diagnosis not present

## 2016-11-11 DIAGNOSIS — Z5189 Encounter for other specified aftercare: Secondary | ICD-10-CM | POA: Diagnosis not present

## 2016-11-11 DIAGNOSIS — Z96652 Presence of left artificial knee joint: Secondary | ICD-10-CM | POA: Diagnosis not present

## 2016-11-12 DIAGNOSIS — Z471 Aftercare following joint replacement surgery: Secondary | ICD-10-CM | POA: Diagnosis not present

## 2016-11-12 DIAGNOSIS — Z96652 Presence of left artificial knee joint: Secondary | ICD-10-CM | POA: Diagnosis not present

## 2016-11-14 DIAGNOSIS — Z5189 Encounter for other specified aftercare: Secondary | ICD-10-CM | POA: Diagnosis not present

## 2016-11-14 DIAGNOSIS — Z96652 Presence of left artificial knee joint: Secondary | ICD-10-CM | POA: Diagnosis not present

## 2016-11-19 DIAGNOSIS — Z5189 Encounter for other specified aftercare: Secondary | ICD-10-CM | POA: Diagnosis not present

## 2016-11-19 DIAGNOSIS — Z96652 Presence of left artificial knee joint: Secondary | ICD-10-CM | POA: Diagnosis not present

## 2016-11-22 DIAGNOSIS — Z5189 Encounter for other specified aftercare: Secondary | ICD-10-CM | POA: Diagnosis not present

## 2016-11-22 DIAGNOSIS — Z96652 Presence of left artificial knee joint: Secondary | ICD-10-CM | POA: Diagnosis not present

## 2016-11-26 DIAGNOSIS — Z5189 Encounter for other specified aftercare: Secondary | ICD-10-CM | POA: Diagnosis not present

## 2016-11-26 DIAGNOSIS — Z96652 Presence of left artificial knee joint: Secondary | ICD-10-CM | POA: Diagnosis not present

## 2016-12-03 IMAGING — US US ABDOMINAL AORTA SCREENING AAA
1 series · 14 of 14 positions shown · non-contrast
Comparison: None

CLINICAL DATA: Medicare screening exam for abdominal aortic
aneurysm. 74-year-old male with smoking history

EXAM:
ABDOMINAL AORTA SCREENING ULTRASOUND
TECHNIQUE: Ultrasound examination of the abdominal aorta was performed as a
screening evaluation for abdominal aortic aneurysm.

[Series 1: us abdominal aorta screening aaa · 0.30mm/px · 14 of 14 slices shown]
[im 1/14]
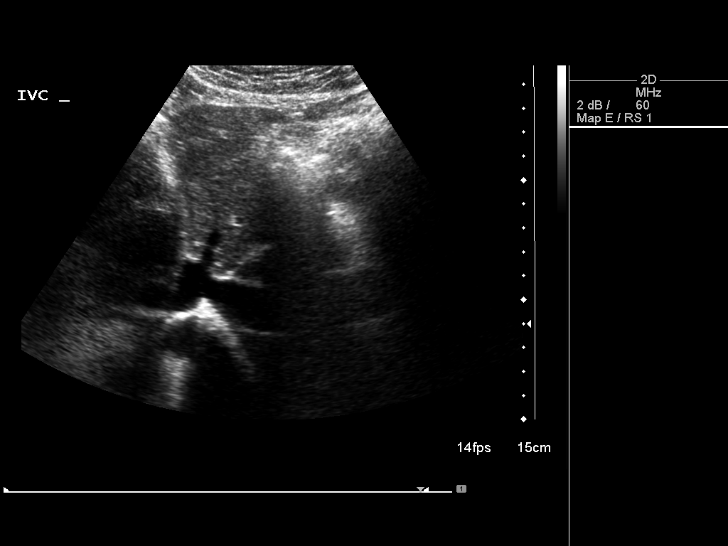
[im 2/14]
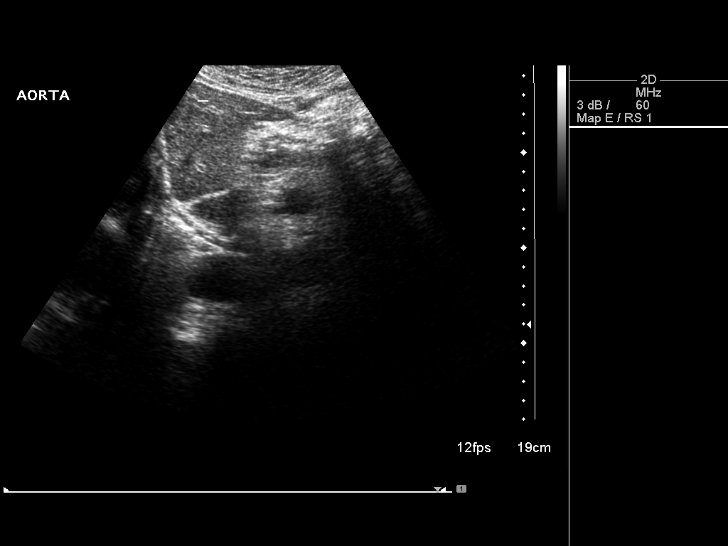
[im 3/14]
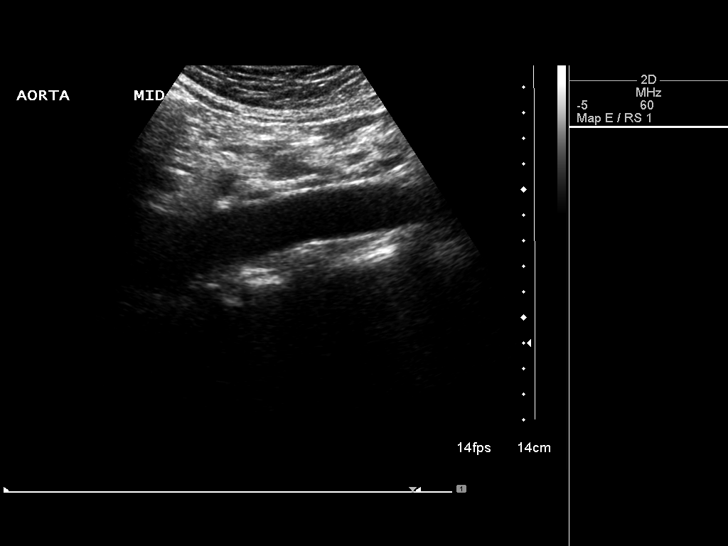
[im 4/14]
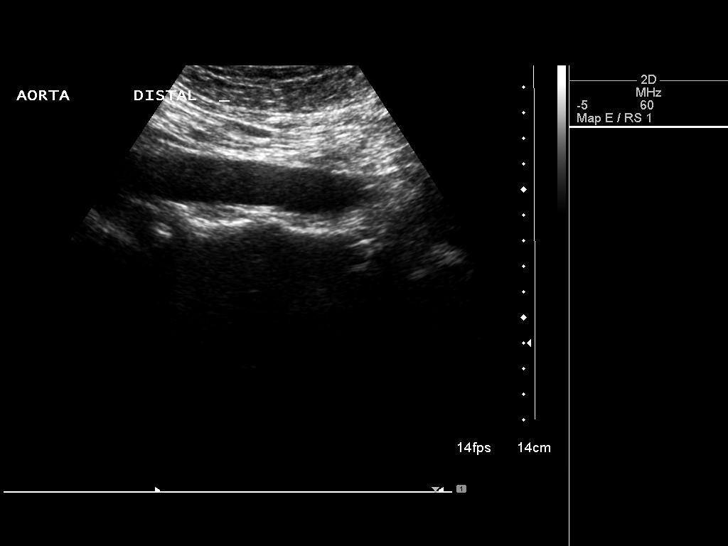
[im 5/14]
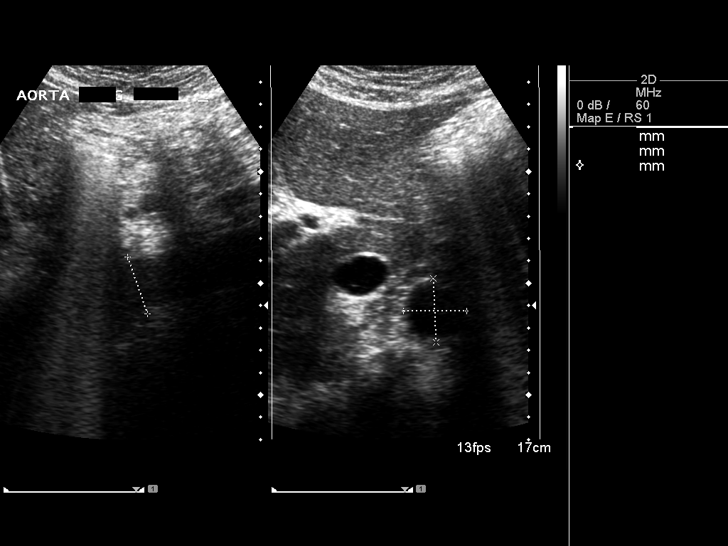
[im 6/14]
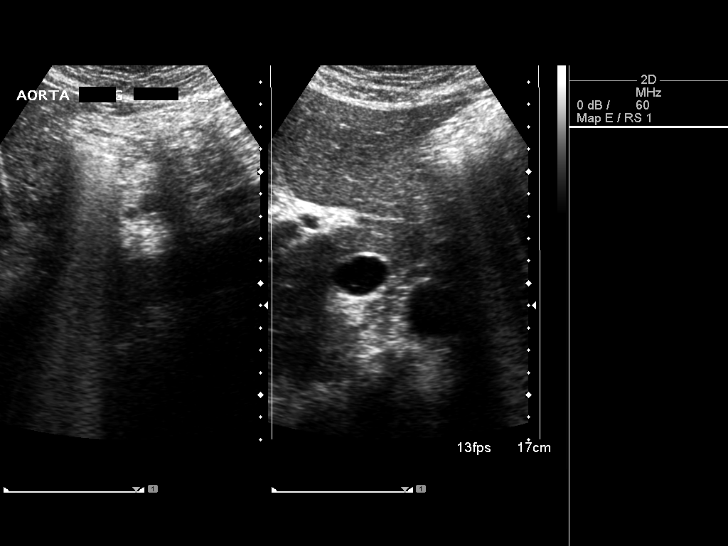
[im 7/14]
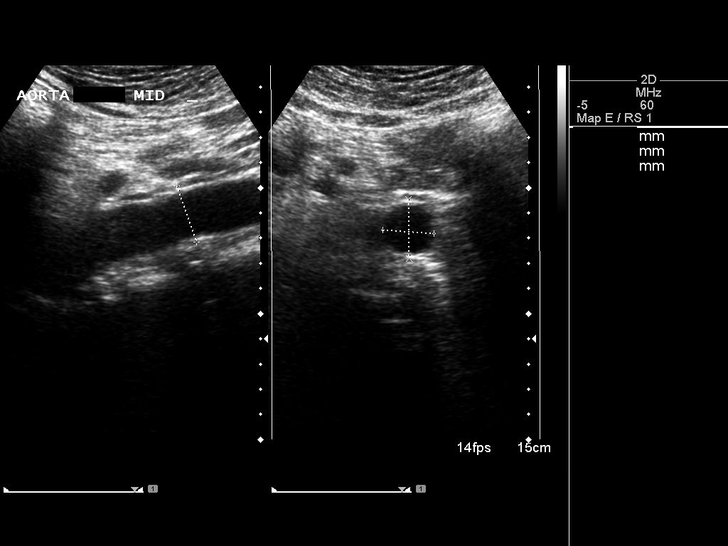
[im 8/14]
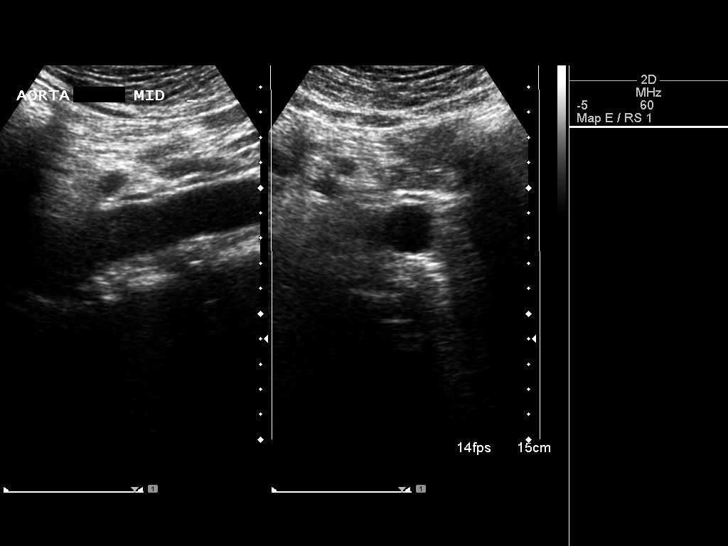
[im 9/14]
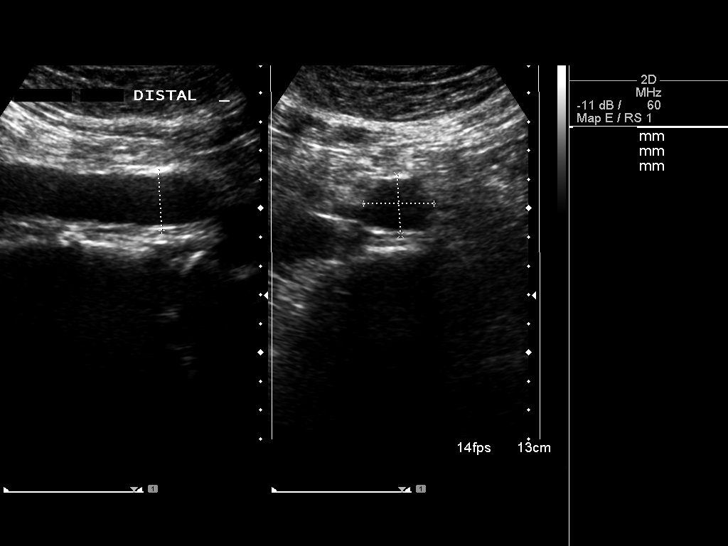
[im 10/14]
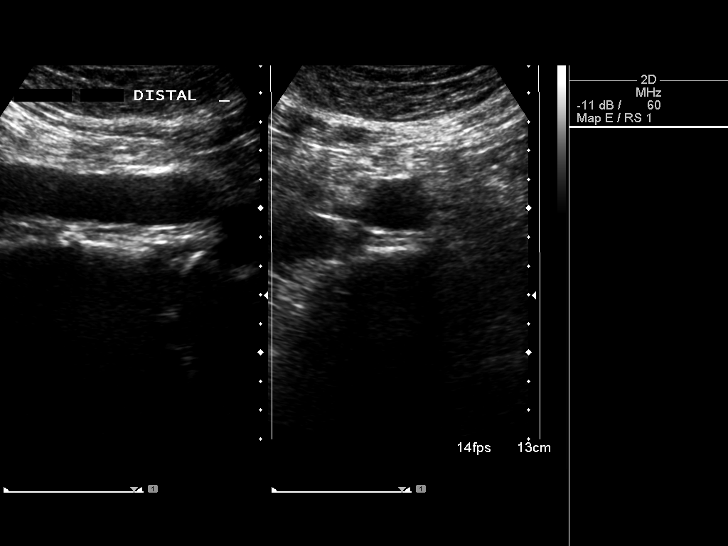
[im 11/14]
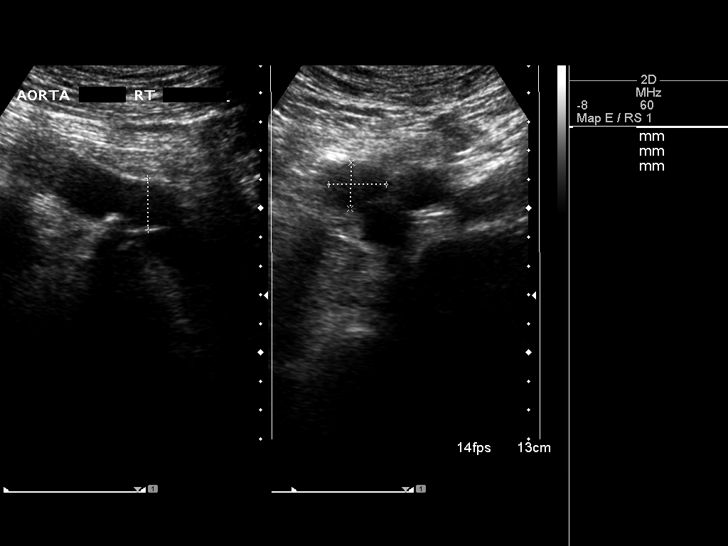
[im 12/14]
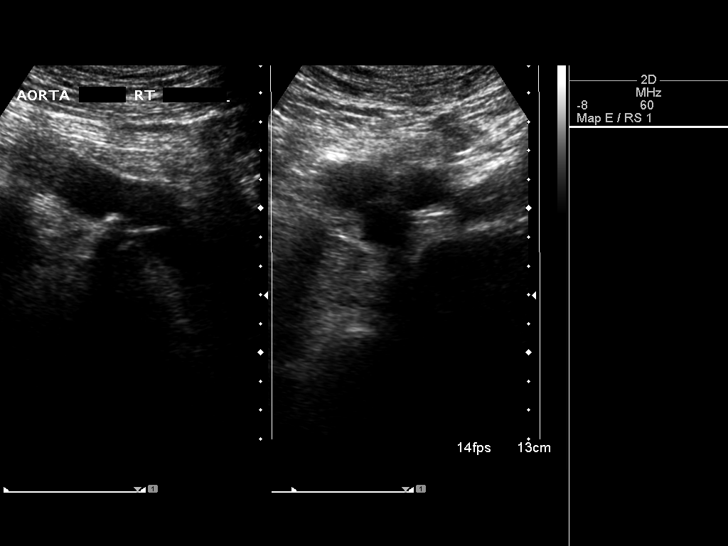
[im 13/14]
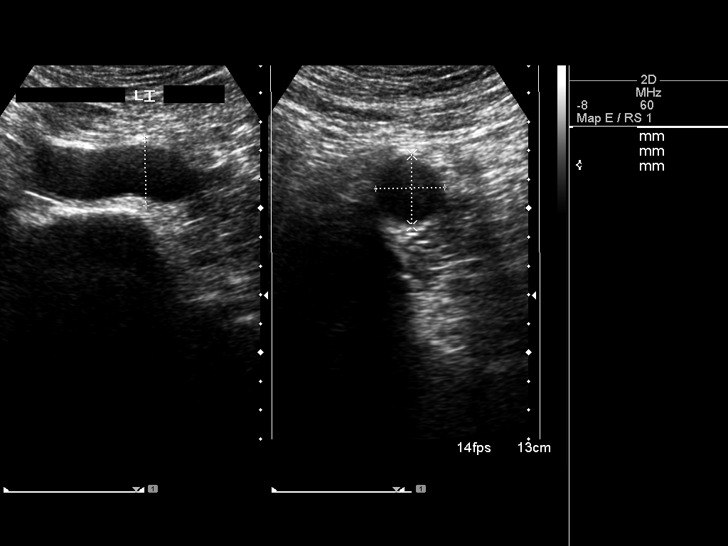
[im 14/14]
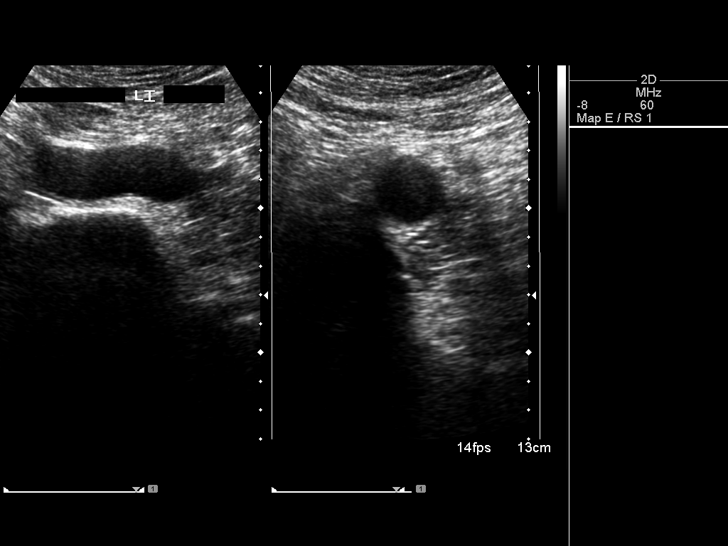

[14 of 14 positions shown; findings below may reference images not displayed]

FINDINGS: Abdominal Aorta

No abdominal aortic aneurysm identified.

Maximum Diameter:

2.9 cm AP proximally

2.4 cm AP mid

2.1 cm AP distally
IMPRESSION: No evidence of abdominal aortic aneurysm.

## 2016-12-20 DIAGNOSIS — Z471 Aftercare following joint replacement surgery: Secondary | ICD-10-CM | POA: Diagnosis not present

## 2017-01-03 DIAGNOSIS — H5203 Hypermetropia, bilateral: Secondary | ICD-10-CM | POA: Diagnosis not present

## 2017-04-07 DIAGNOSIS — R972 Elevated prostate specific antigen [PSA]: Secondary | ICD-10-CM | POA: Diagnosis not present

## 2017-04-22 DIAGNOSIS — E785 Hyperlipidemia, unspecified: Secondary | ICD-10-CM | POA: Diagnosis not present

## 2017-04-23 DIAGNOSIS — N400 Enlarged prostate without lower urinary tract symptoms: Secondary | ICD-10-CM | POA: Diagnosis not present

## 2017-04-23 DIAGNOSIS — R972 Elevated prostate specific antigen [PSA]: Secondary | ICD-10-CM | POA: Diagnosis not present

## 2017-09-19 DIAGNOSIS — R972 Elevated prostate specific antigen [PSA]: Secondary | ICD-10-CM | POA: Diagnosis not present

## 2017-10-02 DIAGNOSIS — L814 Other melanin hyperpigmentation: Secondary | ICD-10-CM | POA: Diagnosis not present

## 2017-10-02 DIAGNOSIS — L812 Freckles: Secondary | ICD-10-CM | POA: Diagnosis not present

## 2017-10-02 DIAGNOSIS — L821 Other seborrheic keratosis: Secondary | ICD-10-CM | POA: Diagnosis not present

## 2017-12-05 DIAGNOSIS — H524 Presbyopia: Secondary | ICD-10-CM | POA: Diagnosis not present

## 2017-12-05 DIAGNOSIS — H2513 Age-related nuclear cataract, bilateral: Secondary | ICD-10-CM | POA: Diagnosis not present

## 2018-01-09 DIAGNOSIS — E785 Hyperlipidemia, unspecified: Secondary | ICD-10-CM | POA: Diagnosis not present

## 2018-01-09 DIAGNOSIS — I679 Cerebrovascular disease, unspecified: Secondary | ICD-10-CM | POA: Diagnosis not present

## 2018-01-09 DIAGNOSIS — N400 Enlarged prostate without lower urinary tract symptoms: Secondary | ICD-10-CM | POA: Diagnosis not present

## 2018-01-09 DIAGNOSIS — Z0000 Encounter for general adult medical examination without abnormal findings: Secondary | ICD-10-CM | POA: Diagnosis not present

## 2018-02-11 DIAGNOSIS — D485 Neoplasm of uncertain behavior of skin: Secondary | ICD-10-CM | POA: Diagnosis not present

## 2018-02-11 DIAGNOSIS — L821 Other seborrheic keratosis: Secondary | ICD-10-CM | POA: Diagnosis not present

## 2018-02-11 DIAGNOSIS — R233 Spontaneous ecchymoses: Secondary | ICD-10-CM | POA: Diagnosis not present

## 2018-02-11 DIAGNOSIS — L570 Actinic keratosis: Secondary | ICD-10-CM | POA: Diagnosis not present

## 2018-02-11 DIAGNOSIS — L814 Other melanin hyperpigmentation: Secondary | ICD-10-CM | POA: Diagnosis not present

## 2018-03-23 DIAGNOSIS — N410 Acute prostatitis: Secondary | ICD-10-CM | POA: Diagnosis not present

## 2018-03-23 DIAGNOSIS — E7800 Pure hypercholesterolemia, unspecified: Secondary | ICD-10-CM | POA: Diagnosis not present

## 2018-03-23 DIAGNOSIS — Z96652 Presence of left artificial knee joint: Secondary | ICD-10-CM | POA: Diagnosis not present

## 2018-03-23 DIAGNOSIS — R55 Syncope and collapse: Secondary | ICD-10-CM | POA: Diagnosis not present

## 2018-03-23 DIAGNOSIS — R509 Fever, unspecified: Secondary | ICD-10-CM | POA: Diagnosis not present

## 2018-03-23 DIAGNOSIS — G459 Transient cerebral ischemic attack, unspecified: Secondary | ICD-10-CM | POA: Diagnosis not present

## 2018-04-20 DIAGNOSIS — N400 Enlarged prostate without lower urinary tract symptoms: Secondary | ICD-10-CM | POA: Diagnosis not present

## 2018-04-20 DIAGNOSIS — R350 Frequency of micturition: Secondary | ICD-10-CM | POA: Diagnosis not present

## 2018-10-05 DIAGNOSIS — Z0120 Encounter for dental examination and cleaning without abnormal findings: Secondary | ICD-10-CM | POA: Diagnosis not present

## 2018-11-20 DIAGNOSIS — L720 Epidermal cyst: Secondary | ICD-10-CM | POA: Diagnosis not present

## 2018-11-20 DIAGNOSIS — L821 Other seborrheic keratosis: Secondary | ICD-10-CM | POA: Diagnosis not present

## 2018-11-20 DIAGNOSIS — L570 Actinic keratosis: Secondary | ICD-10-CM | POA: Diagnosis not present

## 2018-11-20 DIAGNOSIS — L308 Other specified dermatitis: Secondary | ICD-10-CM | POA: Diagnosis not present

## 2019-01-15 DIAGNOSIS — I679 Cerebrovascular disease, unspecified: Secondary | ICD-10-CM | POA: Diagnosis not present

## 2019-01-15 DIAGNOSIS — N400 Enlarged prostate without lower urinary tract symptoms: Secondary | ICD-10-CM | POA: Diagnosis not present

## 2019-01-15 DIAGNOSIS — E785 Hyperlipidemia, unspecified: Secondary | ICD-10-CM | POA: Diagnosis not present

## 2019-01-15 DIAGNOSIS — Z0000 Encounter for general adult medical examination without abnormal findings: Secondary | ICD-10-CM | POA: Diagnosis not present

## 2019-02-17 DIAGNOSIS — H2513 Age-related nuclear cataract, bilateral: Secondary | ICD-10-CM | POA: Diagnosis not present

## 2019-05-05 DIAGNOSIS — L720 Epidermal cyst: Secondary | ICD-10-CM | POA: Diagnosis not present

## 2019-05-05 DIAGNOSIS — E785 Hyperlipidemia, unspecified: Secondary | ICD-10-CM | POA: Diagnosis not present

## 2019-06-18 DIAGNOSIS — Z8673 Personal history of transient ischemic attack (TIA), and cerebral infarction without residual deficits: Secondary | ICD-10-CM | POA: Diagnosis not present

## 2019-06-18 DIAGNOSIS — Z23 Encounter for immunization: Secondary | ICD-10-CM | POA: Diagnosis not present

## 2019-06-18 DIAGNOSIS — E785 Hyperlipidemia, unspecified: Secondary | ICD-10-CM | POA: Diagnosis not present

## 2019-06-18 DIAGNOSIS — Z1211 Encounter for screening for malignant neoplasm of colon: Secondary | ICD-10-CM | POA: Diagnosis not present

## 2019-09-13 DIAGNOSIS — Z86010 Personal history of colonic polyps: Secondary | ICD-10-CM | POA: Diagnosis not present

## 2019-09-13 DIAGNOSIS — E785 Hyperlipidemia, unspecified: Secondary | ICD-10-CM | POA: Diagnosis not present

## 2019-09-13 DIAGNOSIS — Z6827 Body mass index (BMI) 27.0-27.9, adult: Secondary | ICD-10-CM | POA: Diagnosis not present

## 2019-09-13 DIAGNOSIS — Z8673 Personal history of transient ischemic attack (TIA), and cerebral infarction without residual deficits: Secondary | ICD-10-CM | POA: Diagnosis not present

## 2019-10-04 DIAGNOSIS — Z1159 Encounter for screening for other viral diseases: Secondary | ICD-10-CM | POA: Diagnosis not present

## 2019-10-07 DIAGNOSIS — Z86010 Personal history of colonic polyps: Secondary | ICD-10-CM | POA: Diagnosis not present

## 2019-10-07 DIAGNOSIS — Z8673 Personal history of transient ischemic attack (TIA), and cerebral infarction without residual deficits: Secondary | ICD-10-CM | POA: Diagnosis not present

## 2019-10-07 DIAGNOSIS — K648 Other hemorrhoids: Secondary | ICD-10-CM | POA: Diagnosis not present

## 2019-10-07 DIAGNOSIS — K649 Unspecified hemorrhoids: Secondary | ICD-10-CM | POA: Diagnosis not present

## 2019-10-07 DIAGNOSIS — K5730 Diverticulosis of large intestine without perforation or abscess without bleeding: Secondary | ICD-10-CM | POA: Diagnosis not present

## 2019-10-08 DIAGNOSIS — M25552 Pain in left hip: Secondary | ICD-10-CM | POA: Diagnosis not present

## 2019-10-11 DIAGNOSIS — Z0120 Encounter for dental examination and cleaning without abnormal findings: Secondary | ICD-10-CM | POA: Diagnosis not present

## 2019-10-12 DIAGNOSIS — M7062 Trochanteric bursitis, left hip: Secondary | ICD-10-CM | POA: Diagnosis not present

## 2019-10-13 DIAGNOSIS — N320 Bladder-neck obstruction: Secondary | ICD-10-CM | POA: Diagnosis not present

## 2019-10-13 DIAGNOSIS — R350 Frequency of micturition: Secondary | ICD-10-CM | POA: Diagnosis not present

## 2019-10-13 DIAGNOSIS — N400 Enlarged prostate without lower urinary tract symptoms: Secondary | ICD-10-CM | POA: Diagnosis not present

## 2019-10-29 DIAGNOSIS — M5416 Radiculopathy, lumbar region: Secondary | ICD-10-CM | POA: Diagnosis not present

## 2019-11-11 DIAGNOSIS — M4727 Other spondylosis with radiculopathy, lumbosacral region: Secondary | ICD-10-CM | POA: Diagnosis not present

## 2019-11-11 DIAGNOSIS — M5416 Radiculopathy, lumbar region: Secondary | ICD-10-CM | POA: Diagnosis not present

## 2019-11-18 DIAGNOSIS — M5116 Intervertebral disc disorders with radiculopathy, lumbar region: Secondary | ICD-10-CM | POA: Diagnosis not present

## 2019-11-18 DIAGNOSIS — M4727 Other spondylosis with radiculopathy, lumbosacral region: Secondary | ICD-10-CM | POA: Diagnosis not present

## 2019-11-18 DIAGNOSIS — M4726 Other spondylosis with radiculopathy, lumbar region: Secondary | ICD-10-CM | POA: Diagnosis not present

## 2019-11-18 DIAGNOSIS — M5416 Radiculopathy, lumbar region: Secondary | ICD-10-CM | POA: Diagnosis not present

## 2019-11-18 DIAGNOSIS — M48061 Spinal stenosis, lumbar region without neurogenic claudication: Secondary | ICD-10-CM | POA: Diagnosis not present

## 2019-11-19 DIAGNOSIS — M549 Dorsalgia, unspecified: Secondary | ICD-10-CM | POA: Diagnosis not present

## 2019-11-19 DIAGNOSIS — M5416 Radiculopathy, lumbar region: Secondary | ICD-10-CM | POA: Diagnosis not present

## 2019-11-22 DIAGNOSIS — M4727 Other spondylosis with radiculopathy, lumbosacral region: Secondary | ICD-10-CM | POA: Diagnosis not present

## 2019-11-24 DIAGNOSIS — M5416 Radiculopathy, lumbar region: Secondary | ICD-10-CM | POA: Diagnosis not present

## 2019-11-24 DIAGNOSIS — M549 Dorsalgia, unspecified: Secondary | ICD-10-CM | POA: Diagnosis not present

## 2019-11-26 DIAGNOSIS — M5416 Radiculopathy, lumbar region: Secondary | ICD-10-CM | POA: Diagnosis not present

## 2019-11-26 DIAGNOSIS — M549 Dorsalgia, unspecified: Secondary | ICD-10-CM | POA: Diagnosis not present

## 2019-11-30 DIAGNOSIS — M549 Dorsalgia, unspecified: Secondary | ICD-10-CM | POA: Diagnosis not present

## 2019-11-30 DIAGNOSIS — M5416 Radiculopathy, lumbar region: Secondary | ICD-10-CM | POA: Diagnosis not present

## 2019-12-03 DIAGNOSIS — M4727 Other spondylosis with radiculopathy, lumbosacral region: Secondary | ICD-10-CM | POA: Diagnosis not present

## 2019-12-07 DIAGNOSIS — M5416 Radiculopathy, lumbar region: Secondary | ICD-10-CM | POA: Diagnosis not present

## 2019-12-07 DIAGNOSIS — M549 Dorsalgia, unspecified: Secondary | ICD-10-CM | POA: Diagnosis not present

## 2019-12-09 DIAGNOSIS — M5416 Radiculopathy, lumbar region: Secondary | ICD-10-CM | POA: Diagnosis not present

## 2019-12-09 DIAGNOSIS — M549 Dorsalgia, unspecified: Secondary | ICD-10-CM | POA: Diagnosis not present

## 2019-12-14 DIAGNOSIS — M5416 Radiculopathy, lumbar region: Secondary | ICD-10-CM | POA: Diagnosis not present

## 2019-12-14 DIAGNOSIS — M549 Dorsalgia, unspecified: Secondary | ICD-10-CM | POA: Diagnosis not present

## 2019-12-17 DIAGNOSIS — M5416 Radiculopathy, lumbar region: Secondary | ICD-10-CM | POA: Diagnosis not present

## 2019-12-17 DIAGNOSIS — M549 Dorsalgia, unspecified: Secondary | ICD-10-CM | POA: Diagnosis not present

## 2019-12-24 DIAGNOSIS — M5416 Radiculopathy, lumbar region: Secondary | ICD-10-CM | POA: Diagnosis not present

## 2019-12-24 DIAGNOSIS — M549 Dorsalgia, unspecified: Secondary | ICD-10-CM | POA: Diagnosis not present

## 2019-12-28 DIAGNOSIS — M5416 Radiculopathy, lumbar region: Secondary | ICD-10-CM | POA: Diagnosis not present

## 2019-12-28 DIAGNOSIS — M549 Dorsalgia, unspecified: Secondary | ICD-10-CM | POA: Diagnosis not present

## 2020-02-18 DIAGNOSIS — H2513 Age-related nuclear cataract, bilateral: Secondary | ICD-10-CM | POA: Diagnosis not present

## 2020-02-24 DIAGNOSIS — H43811 Vitreous degeneration, right eye: Secondary | ICD-10-CM | POA: Diagnosis not present

## 2020-02-24 DIAGNOSIS — H2513 Age-related nuclear cataract, bilateral: Secondary | ICD-10-CM | POA: Diagnosis not present

## 2020-02-24 DIAGNOSIS — H1789 Other corneal scars and opacities: Secondary | ICD-10-CM | POA: Diagnosis not present

## 2020-04-12 DIAGNOSIS — D1801 Hemangioma of skin and subcutaneous tissue: Secondary | ICD-10-CM | POA: Diagnosis not present

## 2020-04-12 DIAGNOSIS — L578 Other skin changes due to chronic exposure to nonionizing radiation: Secondary | ICD-10-CM | POA: Diagnosis not present

## 2020-04-12 DIAGNOSIS — D044 Carcinoma in situ of skin of scalp and neck: Secondary | ICD-10-CM | POA: Diagnosis not present

## 2020-04-12 DIAGNOSIS — L821 Other seborrheic keratosis: Secondary | ICD-10-CM | POA: Diagnosis not present

## 2020-04-13 DIAGNOSIS — M5416 Radiculopathy, lumbar region: Secondary | ICD-10-CM | POA: Diagnosis not present

## 2020-05-02 DIAGNOSIS — M4727 Other spondylosis with radiculopathy, lumbosacral region: Secondary | ICD-10-CM | POA: Diagnosis not present

## 2020-06-21 DIAGNOSIS — M47816 Spondylosis without myelopathy or radiculopathy, lumbar region: Secondary | ICD-10-CM | POA: Diagnosis not present

## 2020-06-21 DIAGNOSIS — M5137 Other intervertebral disc degeneration, lumbosacral region: Secondary | ICD-10-CM | POA: Diagnosis not present

## 2020-06-21 DIAGNOSIS — M4727 Other spondylosis with radiculopathy, lumbosacral region: Secondary | ICD-10-CM | POA: Diagnosis not present

## 2020-06-21 DIAGNOSIS — M47817 Spondylosis without myelopathy or radiculopathy, lumbosacral region: Secondary | ICD-10-CM | POA: Diagnosis not present

## 2020-06-21 DIAGNOSIS — M5136 Other intervertebral disc degeneration, lumbar region: Secondary | ICD-10-CM | POA: Diagnosis not present

## 2020-07-10 DIAGNOSIS — M5126 Other intervertebral disc displacement, lumbar region: Secondary | ICD-10-CM | POA: Diagnosis not present

## 2020-07-10 DIAGNOSIS — M4727 Other spondylosis with radiculopathy, lumbosacral region: Secondary | ICD-10-CM | POA: Diagnosis not present

## 2020-10-04 DIAGNOSIS — Z125 Encounter for screening for malignant neoplasm of prostate: Secondary | ICD-10-CM | POA: Diagnosis not present

## 2020-10-04 DIAGNOSIS — E782 Mixed hyperlipidemia: Secondary | ICD-10-CM | POA: Diagnosis not present

## 2020-10-06 DIAGNOSIS — R972 Elevated prostate specific antigen [PSA]: Secondary | ICD-10-CM | POA: Diagnosis not present

## 2020-10-06 DIAGNOSIS — E785 Hyperlipidemia, unspecified: Secondary | ICD-10-CM | POA: Diagnosis not present

## 2020-10-06 DIAGNOSIS — Z0000 Encounter for general adult medical examination without abnormal findings: Secondary | ICD-10-CM | POA: Diagnosis not present

## 2020-10-06 DIAGNOSIS — Z8673 Personal history of transient ischemic attack (TIA), and cerebral infarction without residual deficits: Secondary | ICD-10-CM | POA: Diagnosis not present

## 2020-10-09 DIAGNOSIS — L814 Other melanin hyperpigmentation: Secondary | ICD-10-CM | POA: Diagnosis not present

## 2020-10-09 DIAGNOSIS — C44319 Basal cell carcinoma of skin of other parts of face: Secondary | ICD-10-CM | POA: Diagnosis not present

## 2020-10-09 DIAGNOSIS — L821 Other seborrheic keratosis: Secondary | ICD-10-CM | POA: Diagnosis not present

## 2020-10-09 DIAGNOSIS — L578 Other skin changes due to chronic exposure to nonionizing radiation: Secondary | ICD-10-CM | POA: Diagnosis not present

## 2020-10-10 DIAGNOSIS — D751 Secondary polycythemia: Secondary | ICD-10-CM | POA: Diagnosis not present

## 2020-10-19 DIAGNOSIS — R972 Elevated prostate specific antigen [PSA]: Secondary | ICD-10-CM | POA: Diagnosis not present

## 2020-10-19 DIAGNOSIS — N419 Inflammatory disease of prostate, unspecified: Secondary | ICD-10-CM | POA: Diagnosis not present

## 2020-10-19 DIAGNOSIS — H90A31 Mixed conductive and sensorineural hearing loss, unilateral, right ear with restricted hearing on the contralateral side: Secondary | ICD-10-CM | POA: Diagnosis not present

## 2020-10-19 DIAGNOSIS — N400 Enlarged prostate without lower urinary tract symptoms: Secondary | ICD-10-CM | POA: Diagnosis not present

## 2020-10-19 DIAGNOSIS — I10 Essential (primary) hypertension: Secondary | ICD-10-CM | POA: Diagnosis not present

## 2020-10-25 DIAGNOSIS — M47812 Spondylosis without myelopathy or radiculopathy, cervical region: Secondary | ICD-10-CM | POA: Diagnosis not present

## 2020-10-25 DIAGNOSIS — M542 Cervicalgia: Secondary | ICD-10-CM | POA: Diagnosis not present

## 2020-11-14 DIAGNOSIS — H903 Sensorineural hearing loss, bilateral: Secondary | ICD-10-CM | POA: Diagnosis not present

## 2020-11-14 DIAGNOSIS — I10 Essential (primary) hypertension: Secondary | ICD-10-CM | POA: Diagnosis not present

## 2020-11-16 DIAGNOSIS — G4730 Sleep apnea, unspecified: Secondary | ICD-10-CM | POA: Diagnosis not present

## 2020-11-16 DIAGNOSIS — G4700 Insomnia, unspecified: Secondary | ICD-10-CM | POA: Diagnosis not present

## 2020-11-16 DIAGNOSIS — R0683 Snoring: Secondary | ICD-10-CM | POA: Diagnosis not present

## 2020-11-16 DIAGNOSIS — Z20822 Contact with and (suspected) exposure to covid-19: Secondary | ICD-10-CM | POA: Diagnosis not present

## 2020-12-14 DIAGNOSIS — M1711 Unilateral primary osteoarthritis, right knee: Secondary | ICD-10-CM | POA: Diagnosis not present

## 2020-12-14 DIAGNOSIS — M25561 Pain in right knee: Secondary | ICD-10-CM | POA: Diagnosis not present

## 2021-01-04 DIAGNOSIS — Z6828 Body mass index (BMI) 28.0-28.9, adult: Secondary | ICD-10-CM | POA: Diagnosis not present

## 2021-01-04 DIAGNOSIS — N411 Chronic prostatitis: Secondary | ICD-10-CM | POA: Diagnosis not present

## 2021-01-04 DIAGNOSIS — E785 Hyperlipidemia, unspecified: Secondary | ICD-10-CM | POA: Diagnosis not present

## 2021-01-04 DIAGNOSIS — D751 Secondary polycythemia: Secondary | ICD-10-CM | POA: Diagnosis not present

## 2021-02-12 DIAGNOSIS — M4727 Other spondylosis with radiculopathy, lumbosacral region: Secondary | ICD-10-CM | POA: Diagnosis not present

## 2021-03-01 DIAGNOSIS — H2513 Age-related nuclear cataract, bilateral: Secondary | ICD-10-CM | POA: Diagnosis not present

## 2021-03-21 DIAGNOSIS — Z08 Encounter for follow-up examination after completed treatment for malignant neoplasm: Secondary | ICD-10-CM | POA: Diagnosis not present

## 2021-03-21 DIAGNOSIS — Z85828 Personal history of other malignant neoplasm of skin: Secondary | ICD-10-CM | POA: Diagnosis not present

## 2021-03-21 DIAGNOSIS — L820 Inflamed seborrheic keratosis: Secondary | ICD-10-CM | POA: Diagnosis not present

## 2021-03-21 DIAGNOSIS — C4321 Malignant melanoma of right ear and external auricular canal: Secondary | ICD-10-CM | POA: Diagnosis not present

## 2021-03-21 DIAGNOSIS — L538 Other specified erythematous conditions: Secondary | ICD-10-CM | POA: Diagnosis not present

## 2021-03-21 DIAGNOSIS — H6983 Other specified disorders of Eustachian tube, bilateral: Secondary | ICD-10-CM | POA: Diagnosis not present

## 2021-03-21 DIAGNOSIS — H903 Sensorineural hearing loss, bilateral: Secondary | ICD-10-CM | POA: Diagnosis not present

## 2021-04-06 DIAGNOSIS — H9113 Presbycusis, bilateral: Secondary | ICD-10-CM | POA: Diagnosis not present

## 2021-04-06 DIAGNOSIS — M5126 Other intervertebral disc displacement, lumbar region: Secondary | ICD-10-CM | POA: Diagnosis not present

## 2021-04-06 DIAGNOSIS — C4321 Malignant melanoma of right ear and external auricular canal: Secondary | ICD-10-CM | POA: Diagnosis not present

## 2021-04-06 DIAGNOSIS — C439 Malignant melanoma of skin, unspecified: Secondary | ICD-10-CM | POA: Diagnosis not present

## 2021-04-06 DIAGNOSIS — Z8673 Personal history of transient ischemic attack (TIA), and cerebral infarction without residual deficits: Secondary | ICD-10-CM | POA: Diagnosis not present

## 2021-04-06 DIAGNOSIS — Z96652 Presence of left artificial knee joint: Secondary | ICD-10-CM | POA: Diagnosis not present

## 2021-04-10 DIAGNOSIS — L578 Other skin changes due to chronic exposure to nonionizing radiation: Secondary | ICD-10-CM | POA: Diagnosis not present

## 2021-04-10 DIAGNOSIS — D0439 Carcinoma in situ of skin of other parts of face: Secondary | ICD-10-CM | POA: Diagnosis not present

## 2021-04-10 DIAGNOSIS — L814 Other melanin hyperpigmentation: Secondary | ICD-10-CM | POA: Diagnosis not present

## 2021-04-10 DIAGNOSIS — R233 Spontaneous ecchymoses: Secondary | ICD-10-CM | POA: Diagnosis not present

## 2021-04-12 DIAGNOSIS — C4321 Malignant melanoma of right ear and external auricular canal: Secondary | ICD-10-CM | POA: Diagnosis not present

## 2021-04-18 DIAGNOSIS — C4321 Malignant melanoma of right ear and external auricular canal: Secondary | ICD-10-CM | POA: Diagnosis not present

## 2021-05-03 DIAGNOSIS — R972 Elevated prostate specific antigen [PSA]: Secondary | ICD-10-CM | POA: Diagnosis not present

## 2021-05-03 DIAGNOSIS — N400 Enlarged prostate without lower urinary tract symptoms: Secondary | ICD-10-CM | POA: Diagnosis not present

## 2021-05-16 DIAGNOSIS — Z6826 Body mass index (BMI) 26.0-26.9, adult: Secondary | ICD-10-CM | POA: Diagnosis not present

## 2021-05-16 DIAGNOSIS — G459 Transient cerebral ischemic attack, unspecified: Secondary | ICD-10-CM | POA: Diagnosis not present

## 2021-05-16 DIAGNOSIS — E785 Hyperlipidemia, unspecified: Secondary | ICD-10-CM | POA: Diagnosis not present

## 2021-05-16 DIAGNOSIS — D751 Secondary polycythemia: Secondary | ICD-10-CM | POA: Diagnosis not present

## 2021-05-18 DIAGNOSIS — G459 Transient cerebral ischemic attack, unspecified: Secondary | ICD-10-CM | POA: Diagnosis not present

## 2021-05-18 DIAGNOSIS — Z8673 Personal history of transient ischemic attack (TIA), and cerebral infarction without residual deficits: Secondary | ICD-10-CM | POA: Diagnosis not present

## 2021-05-26 DIAGNOSIS — G459 Transient cerebral ischemic attack, unspecified: Secondary | ICD-10-CM | POA: Diagnosis not present

## 2021-06-01 DIAGNOSIS — C4321 Malignant melanoma of right ear and external auricular canal: Secondary | ICD-10-CM | POA: Diagnosis not present

## 2021-06-01 DIAGNOSIS — D0321 Melanoma in situ of right ear and external auricular canal: Secondary | ICD-10-CM | POA: Diagnosis not present

## 2021-06-01 DIAGNOSIS — E785 Hyperlipidemia, unspecified: Secondary | ICD-10-CM | POA: Diagnosis not present

## 2021-06-01 DIAGNOSIS — M952 Other acquired deformity of head: Secondary | ICD-10-CM | POA: Diagnosis not present

## 2021-06-01 DIAGNOSIS — R599 Enlarged lymph nodes, unspecified: Secondary | ICD-10-CM | POA: Diagnosis not present

## 2021-06-01 DIAGNOSIS — Z8673 Personal history of transient ischemic attack (TIA), and cerebral infarction without residual deficits: Secondary | ICD-10-CM | POA: Diagnosis not present

## 2021-06-01 DIAGNOSIS — M5126 Other intervertebral disc displacement, lumbar region: Secondary | ICD-10-CM | POA: Diagnosis not present

## 2021-06-12 DIAGNOSIS — C4321 Malignant melanoma of right ear and external auricular canal: Secondary | ICD-10-CM | POA: Diagnosis not present

## 2021-06-12 DIAGNOSIS — Z0000 Encounter for general adult medical examination without abnormal findings: Secondary | ICD-10-CM | POA: Diagnosis not present

## 2021-06-28 DIAGNOSIS — Z0000 Encounter for general adult medical examination without abnormal findings: Secondary | ICD-10-CM | POA: Diagnosis not present

## 2021-06-28 DIAGNOSIS — C4321 Malignant melanoma of right ear and external auricular canal: Secondary | ICD-10-CM | POA: Diagnosis not present

## 2021-07-23 DIAGNOSIS — L570 Actinic keratosis: Secondary | ICD-10-CM | POA: Diagnosis not present

## 2021-07-23 DIAGNOSIS — L814 Other melanin hyperpigmentation: Secondary | ICD-10-CM | POA: Diagnosis not present

## 2021-07-23 DIAGNOSIS — R233 Spontaneous ecchymoses: Secondary | ICD-10-CM | POA: Diagnosis not present

## 2021-07-23 DIAGNOSIS — L578 Other skin changes due to chronic exposure to nonionizing radiation: Secondary | ICD-10-CM | POA: Diagnosis not present

## 2021-07-23 DIAGNOSIS — L821 Other seborrheic keratosis: Secondary | ICD-10-CM | POA: Diagnosis not present

## 2021-07-26 DIAGNOSIS — R413 Other amnesia: Secondary | ICD-10-CM | POA: Diagnosis not present

## 2021-07-26 DIAGNOSIS — G43809 Other migraine, not intractable, without status migrainosus: Secondary | ICD-10-CM | POA: Diagnosis not present

## 2021-09-05 DIAGNOSIS — G43809 Other migraine, not intractable, without status migrainosus: Secondary | ICD-10-CM | POA: Diagnosis not present

## 2021-09-05 DIAGNOSIS — D751 Secondary polycythemia: Secondary | ICD-10-CM | POA: Diagnosis not present

## 2021-09-05 DIAGNOSIS — E785 Hyperlipidemia, unspecified: Secondary | ICD-10-CM | POA: Diagnosis not present

## 2021-10-04 DIAGNOSIS — Z8673 Personal history of transient ischemic attack (TIA), and cerebral infarction without residual deficits: Secondary | ICD-10-CM | POA: Diagnosis not present

## 2021-10-04 DIAGNOSIS — R42 Dizziness and giddiness: Secondary | ICD-10-CM | POA: Diagnosis not present

## 2021-10-04 DIAGNOSIS — Z96652 Presence of left artificial knee joint: Secondary | ICD-10-CM | POA: Diagnosis not present

## 2021-10-04 DIAGNOSIS — Z79899 Other long term (current) drug therapy: Secondary | ICD-10-CM | POA: Diagnosis not present

## 2021-10-04 DIAGNOSIS — R4781 Slurred speech: Secondary | ICD-10-CM | POA: Diagnosis not present

## 2021-10-04 DIAGNOSIS — G458 Other transient cerebral ischemic attacks and related syndromes: Secondary | ICD-10-CM | POA: Diagnosis not present

## 2021-10-04 DIAGNOSIS — E785 Hyperlipidemia, unspecified: Secondary | ICD-10-CM | POA: Diagnosis not present

## 2021-10-04 DIAGNOSIS — G459 Transient cerebral ischemic attack, unspecified: Secondary | ICD-10-CM | POA: Diagnosis not present

## 2021-10-04 DIAGNOSIS — R410 Disorientation, unspecified: Secondary | ICD-10-CM | POA: Diagnosis not present

## 2021-10-04 DIAGNOSIS — Z7982 Long term (current) use of aspirin: Secondary | ICD-10-CM | POA: Diagnosis not present

## 2021-10-05 DIAGNOSIS — R059 Cough, unspecified: Secondary | ICD-10-CM | POA: Diagnosis not present

## 2021-10-05 DIAGNOSIS — R1310 Dysphagia, unspecified: Secondary | ICD-10-CM | POA: Diagnosis not present

## 2021-10-05 DIAGNOSIS — I6521 Occlusion and stenosis of right carotid artery: Secondary | ICD-10-CM | POA: Diagnosis not present

## 2021-10-23 DIAGNOSIS — D044 Carcinoma in situ of skin of scalp and neck: Secondary | ICD-10-CM | POA: Diagnosis not present

## 2021-10-23 DIAGNOSIS — L821 Other seborrheic keratosis: Secondary | ICD-10-CM | POA: Diagnosis not present

## 2021-10-23 DIAGNOSIS — L814 Other melanin hyperpigmentation: Secondary | ICD-10-CM | POA: Diagnosis not present

## 2021-10-23 DIAGNOSIS — R233 Spontaneous ecchymoses: Secondary | ICD-10-CM | POA: Diagnosis not present

## 2021-10-23 DIAGNOSIS — L578 Other skin changes due to chronic exposure to nonionizing radiation: Secondary | ICD-10-CM | POA: Diagnosis not present

## 2021-10-23 DIAGNOSIS — D0439 Carcinoma in situ of skin of other parts of face: Secondary | ICD-10-CM | POA: Diagnosis not present

## 2021-10-23 DIAGNOSIS — L570 Actinic keratosis: Secondary | ICD-10-CM | POA: Diagnosis not present

## 2021-10-23 DIAGNOSIS — L820 Inflamed seborrheic keratosis: Secondary | ICD-10-CM | POA: Diagnosis not present

## 2021-11-08 DIAGNOSIS — N400 Enlarged prostate without lower urinary tract symptoms: Secondary | ICD-10-CM | POA: Diagnosis not present

## 2021-11-08 DIAGNOSIS — R972 Elevated prostate specific antigen [PSA]: Secondary | ICD-10-CM | POA: Diagnosis not present

## 2021-11-08 DIAGNOSIS — N320 Bladder-neck obstruction: Secondary | ICD-10-CM | POA: Diagnosis not present

## 2021-12-14 DIAGNOSIS — G43809 Other migraine, not intractable, without status migrainosus: Secondary | ICD-10-CM | POA: Diagnosis not present

## 2021-12-14 DIAGNOSIS — R413 Other amnesia: Secondary | ICD-10-CM | POA: Diagnosis not present

## 2021-12-20 DIAGNOSIS — R972 Elevated prostate specific antigen [PSA]: Secondary | ICD-10-CM | POA: Diagnosis not present

## 2021-12-20 DIAGNOSIS — N320 Bladder-neck obstruction: Secondary | ICD-10-CM | POA: Diagnosis not present

## 2021-12-20 DIAGNOSIS — N400 Enlarged prostate without lower urinary tract symptoms: Secondary | ICD-10-CM | POA: Diagnosis not present

## 2021-12-24 DIAGNOSIS — L578 Other skin changes due to chronic exposure to nonionizing radiation: Secondary | ICD-10-CM | POA: Diagnosis not present

## 2021-12-24 DIAGNOSIS — L570 Actinic keratosis: Secondary | ICD-10-CM | POA: Diagnosis not present

## 2021-12-24 DIAGNOSIS — D485 Neoplasm of uncertain behavior of skin: Secondary | ICD-10-CM | POA: Diagnosis not present

## 2022-02-21 DIAGNOSIS — C4321 Malignant melanoma of right ear and external auricular canal: Secondary | ICD-10-CM | POA: Diagnosis not present

## 2022-03-01 DIAGNOSIS — M5116 Intervertebral disc disorders with radiculopathy, lumbar region: Secondary | ICD-10-CM | POA: Diagnosis not present

## 2022-03-01 DIAGNOSIS — Z6827 Body mass index (BMI) 27.0-27.9, adult: Secondary | ICD-10-CM | POA: Diagnosis not present

## 2022-03-07 DIAGNOSIS — H43812 Vitreous degeneration, left eye: Secondary | ICD-10-CM | POA: Diagnosis not present

## 2022-03-07 DIAGNOSIS — H1789 Other corneal scars and opacities: Secondary | ICD-10-CM | POA: Diagnosis not present

## 2022-03-07 DIAGNOSIS — H25813 Combined forms of age-related cataract, bilateral: Secondary | ICD-10-CM | POA: Diagnosis not present

## 2022-03-07 DIAGNOSIS — H35013 Changes in retinal vascular appearance, bilateral: Secondary | ICD-10-CM | POA: Diagnosis not present

## 2022-03-25 DIAGNOSIS — B351 Tinea unguium: Secondary | ICD-10-CM | POA: Diagnosis not present

## 2022-03-25 DIAGNOSIS — R233 Spontaneous ecchymoses: Secondary | ICD-10-CM | POA: Diagnosis not present

## 2022-03-25 DIAGNOSIS — L578 Other skin changes due to chronic exposure to nonionizing radiation: Secondary | ICD-10-CM | POA: Diagnosis not present

## 2022-03-25 DIAGNOSIS — L814 Other melanin hyperpigmentation: Secondary | ICD-10-CM | POA: Diagnosis not present

## 2022-03-25 DIAGNOSIS — L570 Actinic keratosis: Secondary | ICD-10-CM | POA: Diagnosis not present

## 2022-03-25 DIAGNOSIS — L820 Inflamed seborrheic keratosis: Secondary | ICD-10-CM | POA: Diagnosis not present

## 2022-03-25 DIAGNOSIS — D485 Neoplasm of uncertain behavior of skin: Secondary | ICD-10-CM | POA: Diagnosis not present

## 2022-03-27 DIAGNOSIS — R972 Elevated prostate specific antigen [PSA]: Secondary | ICD-10-CM | POA: Diagnosis not present

## 2022-03-27 DIAGNOSIS — N320 Bladder-neck obstruction: Secondary | ICD-10-CM | POA: Diagnosis not present

## 2022-03-27 DIAGNOSIS — N400 Enlarged prostate without lower urinary tract symptoms: Secondary | ICD-10-CM | POA: Diagnosis not present

## 2022-04-12 DIAGNOSIS — C61 Malignant neoplasm of prostate: Secondary | ICD-10-CM | POA: Diagnosis not present

## 2022-04-12 DIAGNOSIS — R972 Elevated prostate specific antigen [PSA]: Secondary | ICD-10-CM | POA: Diagnosis not present

## 2022-04-17 DIAGNOSIS — H25813 Combined forms of age-related cataract, bilateral: Secondary | ICD-10-CM | POA: Diagnosis not present

## 2022-04-23 DIAGNOSIS — H2511 Age-related nuclear cataract, right eye: Secondary | ICD-10-CM | POA: Diagnosis not present

## 2022-04-23 DIAGNOSIS — H25811 Combined forms of age-related cataract, right eye: Secondary | ICD-10-CM | POA: Diagnosis not present

## 2022-04-23 DIAGNOSIS — H1789 Other corneal scars and opacities: Secondary | ICD-10-CM | POA: Diagnosis not present

## 2022-04-23 DIAGNOSIS — H43812 Vitreous degeneration, left eye: Secondary | ICD-10-CM | POA: Diagnosis not present

## 2022-04-23 DIAGNOSIS — H35013 Changes in retinal vascular appearance, bilateral: Secondary | ICD-10-CM | POA: Diagnosis not present

## 2022-04-26 DIAGNOSIS — C61 Malignant neoplasm of prostate: Secondary | ICD-10-CM | POA: Diagnosis not present

## 2022-04-29 DIAGNOSIS — C61 Malignant neoplasm of prostate: Secondary | ICD-10-CM | POA: Diagnosis not present

## 2022-05-07 DIAGNOSIS — H25812 Combined forms of age-related cataract, left eye: Secondary | ICD-10-CM | POA: Diagnosis not present

## 2022-05-08 DIAGNOSIS — C61 Malignant neoplasm of prostate: Secondary | ICD-10-CM | POA: Diagnosis not present

## 2022-05-08 DIAGNOSIS — N400 Enlarged prostate without lower urinary tract symptoms: Secondary | ICD-10-CM | POA: Diagnosis not present

## 2022-05-08 DIAGNOSIS — M5136 Other intervertebral disc degeneration, lumbar region: Secondary | ICD-10-CM | POA: Diagnosis not present

## 2022-05-14 DIAGNOSIS — H25812 Combined forms of age-related cataract, left eye: Secondary | ICD-10-CM | POA: Diagnosis not present

## 2022-05-14 DIAGNOSIS — H35013 Changes in retinal vascular appearance, bilateral: Secondary | ICD-10-CM | POA: Diagnosis not present

## 2022-05-14 DIAGNOSIS — H2512 Age-related nuclear cataract, left eye: Secondary | ICD-10-CM | POA: Diagnosis not present

## 2022-05-14 DIAGNOSIS — H1789 Other corneal scars and opacities: Secondary | ICD-10-CM | POA: Diagnosis not present

## 2022-05-14 DIAGNOSIS — H43812 Vitreous degeneration, left eye: Secondary | ICD-10-CM | POA: Diagnosis not present

## 2022-05-17 DIAGNOSIS — N320 Bladder-neck obstruction: Secondary | ICD-10-CM | POA: Diagnosis not present

## 2022-05-17 DIAGNOSIS — N400 Enlarged prostate without lower urinary tract symptoms: Secondary | ICD-10-CM | POA: Diagnosis not present

## 2022-05-17 DIAGNOSIS — C61 Malignant neoplasm of prostate: Secondary | ICD-10-CM | POA: Diagnosis not present

## 2022-06-05 DIAGNOSIS — H5213 Myopia, bilateral: Secondary | ICD-10-CM | POA: Diagnosis not present

## 2022-06-07 DIAGNOSIS — R413 Other amnesia: Secondary | ICD-10-CM | POA: Diagnosis not present

## 2022-06-07 DIAGNOSIS — N419 Inflammatory disease of prostate, unspecified: Secondary | ICD-10-CM | POA: Diagnosis not present

## 2022-06-07 DIAGNOSIS — G43009 Migraine without aura, not intractable, without status migrainosus: Secondary | ICD-10-CM | POA: Diagnosis not present

## 2022-06-17 DIAGNOSIS — M7061 Trochanteric bursitis, right hip: Secondary | ICD-10-CM | POA: Diagnosis not present

## 2022-06-17 DIAGNOSIS — M25551 Pain in right hip: Secondary | ICD-10-CM | POA: Diagnosis not present

## 2022-06-20 ENCOUNTER — Telehealth: Payer: Self-pay

## 2022-06-20 NOTE — Patient Outreach (Signed)
  Care Coordination   06/20/2022 Name: Brandon Stein MRN: [id number] DOB: 1941/01/28   Care Coordination Outreach Attempts:  An unsuccessful telephone outreach was attempted today to offer the patient information about available care coordination services as a benefit of their health plan.   Follow Up Plan:  Additional outreach attempts will be made to offer the patient care coordination information and services.   Encounter Outcome:  No Answer  Care Coordination Interventions Activated:  No   Care Coordination Interventions:  No, not indicated     Jone Baseman, RN, MSN [facility] Care Management Care Management Coordinator Direct Line [phone]

## 2022-06-24 DIAGNOSIS — C61 Malignant neoplasm of prostate: Secondary | ICD-10-CM | POA: Diagnosis not present

## 2022-06-24 DIAGNOSIS — N400 Enlarged prostate without lower urinary tract symptoms: Secondary | ICD-10-CM | POA: Diagnosis not present

## 2022-06-24 DIAGNOSIS — R310 Gross hematuria: Secondary | ICD-10-CM | POA: Diagnosis not present

## 2022-06-26 DIAGNOSIS — R310 Gross hematuria: Secondary | ICD-10-CM | POA: Diagnosis not present

## 2022-07-01 ENCOUNTER — Telehealth: Payer: Self-pay

## 2022-07-01 NOTE — Patient Outreach (Signed)
  Care Coordination   07/01/2022 Name: CHARVIS LIGHTNER MRN: [id number] DOB: 1940-11-01   Care Coordination Outreach Attempts:  A second unsuccessful outreach was attempted today to offer the patient with information about available care coordination services as a benefit of their health plan.     Follow Up Plan:  Additional outreach attempts will be made to offer the patient care coordination information and services.   Encounter Outcome:  Pt. Request to Call Back  Care Coordination Interventions Activated:  No   Care Coordination Interventions:  No, not indicated    Jone Baseman, RN, MSN [facility] Care Management Coordinator Direct Line [phone]

## 2022-07-02 DIAGNOSIS — Z8673 Personal history of transient ischemic attack (TIA), and cerebral infarction without residual deficits: Secondary | ICD-10-CM | POA: Diagnosis not present

## 2022-07-02 DIAGNOSIS — M7071 Other bursitis of hip, right hip: Secondary | ICD-10-CM | POA: Diagnosis not present

## 2022-07-11 ENCOUNTER — Telehealth: Payer: Self-pay

## 2022-07-11 NOTE — Patient Outreach (Signed)
  Care Coordination   07/11/2022 Name: [name] MRN: [id number] DOB: 1940-12-25   Care Coordination Outreach Attempts:  A third unsuccessful outreach was attempted today to offer the patient with information about available care coordination services as a benefit of their health plan.   Follow Up Plan:  No further outreach attempts will be made at this time. We have been unable to contact the patient to offer or enroll patient in care coordination services  Encounter Outcome:  No Answer  Care Coordination Interventions Activated:  No   Care Coordination Interventions:  No, not indicated    Jone Baseman, RN, MSN [place] [facility] Care Management Coordinator Direct Line [phone]

## 2022-07-18 DIAGNOSIS — M25551 Pain in right hip: Secondary | ICD-10-CM | POA: Diagnosis not present

## 2022-07-22 DIAGNOSIS — M25551 Pain in right hip: Secondary | ICD-10-CM | POA: Diagnosis not present

## 2022-07-24 DIAGNOSIS — M25551 Pain in right hip: Secondary | ICD-10-CM | POA: Diagnosis not present

## 2022-07-29 DIAGNOSIS — M25551 Pain in right hip: Secondary | ICD-10-CM | POA: Diagnosis not present

## 2022-07-31 DIAGNOSIS — M25551 Pain in right hip: Secondary | ICD-10-CM | POA: Diagnosis not present

## 2022-08-08 DIAGNOSIS — N400 Enlarged prostate without lower urinary tract symptoms: Secondary | ICD-10-CM | POA: Diagnosis not present

## 2022-08-08 DIAGNOSIS — N320 Bladder-neck obstruction: Secondary | ICD-10-CM | POA: Diagnosis not present

## 2022-08-08 DIAGNOSIS — C61 Malignant neoplasm of prostate: Secondary | ICD-10-CM | POA: Diagnosis not present

## 2022-08-30 DIAGNOSIS — I491 Atrial premature depolarization: Secondary | ICD-10-CM | POA: Diagnosis not present

## 2022-08-30 DIAGNOSIS — R053 Chronic cough: Secondary | ICD-10-CM | POA: Diagnosis not present

## 2022-08-30 DIAGNOSIS — Z6826 Body mass index (BMI) 26.0-26.9, adult: Secondary | ICD-10-CM | POA: Diagnosis not present

## 2022-09-25 DIAGNOSIS — L821 Other seborrheic keratosis: Secondary | ICD-10-CM | POA: Diagnosis not present

## 2022-09-25 DIAGNOSIS — L578 Other skin changes due to chronic exposure to nonionizing radiation: Secondary | ICD-10-CM | POA: Diagnosis not present

## 2022-09-25 DIAGNOSIS — D044 Carcinoma in situ of skin of scalp and neck: Secondary | ICD-10-CM | POA: Diagnosis not present

## 2022-09-25 DIAGNOSIS — R233 Spontaneous ecchymoses: Secondary | ICD-10-CM | POA: Diagnosis not present

## 2022-09-25 DIAGNOSIS — D1801 Hemangioma of skin and subcutaneous tissue: Secondary | ICD-10-CM | POA: Diagnosis not present

## 2022-09-25 DIAGNOSIS — D0439 Carcinoma in situ of skin of other parts of face: Secondary | ICD-10-CM | POA: Diagnosis not present

## 2022-09-25 DIAGNOSIS — L814 Other melanin hyperpigmentation: Secondary | ICD-10-CM | POA: Diagnosis not present

## 2022-10-25 DIAGNOSIS — M79641 Pain in right hand: Secondary | ICD-10-CM | POA: Diagnosis not present

## 2022-10-25 DIAGNOSIS — M79601 Pain in right arm: Secondary | ICD-10-CM | POA: Diagnosis not present

## 2022-10-25 DIAGNOSIS — M67911 Unspecified disorder of synovium and tendon, right shoulder: Secondary | ICD-10-CM | POA: Diagnosis not present

## 2022-10-25 DIAGNOSIS — R29898 Other symptoms and signs involving the musculoskeletal system: Secondary | ICD-10-CM | POA: Diagnosis not present

## 2022-10-25 DIAGNOSIS — M19011 Primary osteoarthritis, right shoulder: Secondary | ICD-10-CM | POA: Diagnosis not present

## 2022-11-05 DIAGNOSIS — M25511 Pain in right shoulder: Secondary | ICD-10-CM | POA: Diagnosis not present

## 2022-11-06 DIAGNOSIS — C61 Malignant neoplasm of prostate: Secondary | ICD-10-CM | POA: Diagnosis not present

## 2022-11-06 DIAGNOSIS — N400 Enlarged prostate without lower urinary tract symptoms: Secondary | ICD-10-CM | POA: Diagnosis not present

## 2022-11-11 DIAGNOSIS — M25511 Pain in right shoulder: Secondary | ICD-10-CM | POA: Diagnosis not present

## 2022-11-12 DIAGNOSIS — C61 Malignant neoplasm of prostate: Secondary | ICD-10-CM | POA: Diagnosis not present

## 2022-11-13 DIAGNOSIS — M25511 Pain in right shoulder: Secondary | ICD-10-CM | POA: Diagnosis not present

## 2022-11-19 DIAGNOSIS — M25511 Pain in right shoulder: Secondary | ICD-10-CM | POA: Diagnosis not present

## 2022-11-21 DIAGNOSIS — M25511 Pain in right shoulder: Secondary | ICD-10-CM | POA: Diagnosis not present

## 2022-11-26 DIAGNOSIS — M25511 Pain in right shoulder: Secondary | ICD-10-CM | POA: Diagnosis not present

## 2022-11-27 DIAGNOSIS — C61 Malignant neoplasm of prostate: Secondary | ICD-10-CM | POA: Diagnosis not present

## 2022-11-27 DIAGNOSIS — Z510 Encounter for antineoplastic radiation therapy: Secondary | ICD-10-CM | POA: Diagnosis not present

## 2022-11-27 DIAGNOSIS — C4321 Malignant melanoma of right ear and external auricular canal: Secondary | ICD-10-CM | POA: Diagnosis not present

## 2022-11-28 DIAGNOSIS — M25511 Pain in right shoulder: Secondary | ICD-10-CM | POA: Diagnosis not present

## 2022-11-29 DIAGNOSIS — C4321 Malignant melanoma of right ear and external auricular canal: Secondary | ICD-10-CM | POA: Diagnosis not present

## 2022-12-03 DIAGNOSIS — Z961 Presence of intraocular lens: Secondary | ICD-10-CM | POA: Diagnosis not present

## 2022-12-03 DIAGNOSIS — H26493 Other secondary cataract, bilateral: Secondary | ICD-10-CM | POA: Diagnosis not present

## 2022-12-03 DIAGNOSIS — H43813 Vitreous degeneration, bilateral: Secondary | ICD-10-CM | POA: Diagnosis not present

## 2022-12-06 DIAGNOSIS — M19011 Primary osteoarthritis, right shoulder: Secondary | ICD-10-CM | POA: Diagnosis not present

## 2022-12-23 DIAGNOSIS — C61 Malignant neoplasm of prostate: Secondary | ICD-10-CM | POA: Diagnosis not present

## 2022-12-26 DIAGNOSIS — C61 Malignant neoplasm of prostate: Secondary | ICD-10-CM | POA: Diagnosis not present

## 2022-12-26 DIAGNOSIS — Z510 Encounter for antineoplastic radiation therapy: Secondary | ICD-10-CM | POA: Diagnosis not present

## 2023-01-06 DIAGNOSIS — Z510 Encounter for antineoplastic radiation therapy: Secondary | ICD-10-CM | POA: Diagnosis not present

## 2023-01-06 DIAGNOSIS — C4321 Malignant melanoma of right ear and external auricular canal: Secondary | ICD-10-CM | POA: Diagnosis not present

## 2023-01-06 DIAGNOSIS — C61 Malignant neoplasm of prostate: Secondary | ICD-10-CM | POA: Diagnosis not present

## 2023-01-13 DIAGNOSIS — Z510 Encounter for antineoplastic radiation therapy: Secondary | ICD-10-CM | POA: Diagnosis not present

## 2023-01-13 DIAGNOSIS — C4321 Malignant melanoma of right ear and external auricular canal: Secondary | ICD-10-CM | POA: Diagnosis not present

## 2023-01-13 DIAGNOSIS — C61 Malignant neoplasm of prostate: Secondary | ICD-10-CM | POA: Diagnosis not present

## 2023-01-14 DIAGNOSIS — C61 Malignant neoplasm of prostate: Secondary | ICD-10-CM | POA: Diagnosis not present

## 2023-01-14 DIAGNOSIS — Z510 Encounter for antineoplastic radiation therapy: Secondary | ICD-10-CM | POA: Diagnosis not present

## 2023-01-14 DIAGNOSIS — C4321 Malignant melanoma of right ear and external auricular canal: Secondary | ICD-10-CM | POA: Diagnosis not present

## 2023-01-15 DIAGNOSIS — Z510 Encounter for antineoplastic radiation therapy: Secondary | ICD-10-CM | POA: Diagnosis not present

## 2023-01-15 DIAGNOSIS — C4321 Malignant melanoma of right ear and external auricular canal: Secondary | ICD-10-CM | POA: Diagnosis not present

## 2023-01-15 DIAGNOSIS — C61 Malignant neoplasm of prostate: Secondary | ICD-10-CM | POA: Diagnosis not present

## 2023-01-16 DIAGNOSIS — C61 Malignant neoplasm of prostate: Secondary | ICD-10-CM | POA: Diagnosis not present

## 2023-01-16 DIAGNOSIS — C4321 Malignant melanoma of right ear and external auricular canal: Secondary | ICD-10-CM | POA: Diagnosis not present

## 2023-01-16 DIAGNOSIS — Z510 Encounter for antineoplastic radiation therapy: Secondary | ICD-10-CM | POA: Diagnosis not present

## 2023-01-17 DIAGNOSIS — Z510 Encounter for antineoplastic radiation therapy: Secondary | ICD-10-CM | POA: Diagnosis not present

## 2023-01-17 DIAGNOSIS — C61 Malignant neoplasm of prostate: Secondary | ICD-10-CM | POA: Diagnosis not present

## 2023-01-17 DIAGNOSIS — C4321 Malignant melanoma of right ear and external auricular canal: Secondary | ICD-10-CM | POA: Diagnosis not present

## 2023-01-20 DIAGNOSIS — Z510 Encounter for antineoplastic radiation therapy: Secondary | ICD-10-CM | POA: Diagnosis not present

## 2023-01-20 DIAGNOSIS — C61 Malignant neoplasm of prostate: Secondary | ICD-10-CM | POA: Diagnosis not present

## 2023-01-20 DIAGNOSIS — C4321 Malignant melanoma of right ear and external auricular canal: Secondary | ICD-10-CM | POA: Diagnosis not present

## 2023-01-21 DIAGNOSIS — C61 Malignant neoplasm of prostate: Secondary | ICD-10-CM | POA: Diagnosis not present

## 2023-01-21 DIAGNOSIS — Z510 Encounter for antineoplastic radiation therapy: Secondary | ICD-10-CM | POA: Diagnosis not present

## 2023-01-21 DIAGNOSIS — C4321 Malignant melanoma of right ear and external auricular canal: Secondary | ICD-10-CM | POA: Diagnosis not present

## 2023-01-22 DIAGNOSIS — C61 Malignant neoplasm of prostate: Secondary | ICD-10-CM | POA: Diagnosis not present

## 2023-01-22 DIAGNOSIS — C4321 Malignant melanoma of right ear and external auricular canal: Secondary | ICD-10-CM | POA: Diagnosis not present

## 2023-01-22 DIAGNOSIS — Z510 Encounter for antineoplastic radiation therapy: Secondary | ICD-10-CM | POA: Diagnosis not present

## 2023-01-23 DIAGNOSIS — C4321 Malignant melanoma of right ear and external auricular canal: Secondary | ICD-10-CM | POA: Diagnosis not present

## 2023-01-23 DIAGNOSIS — C61 Malignant neoplasm of prostate: Secondary | ICD-10-CM | POA: Diagnosis not present

## 2023-01-23 DIAGNOSIS — Z510 Encounter for antineoplastic radiation therapy: Secondary | ICD-10-CM | POA: Diagnosis not present

## 2023-01-24 DIAGNOSIS — C61 Malignant neoplasm of prostate: Secondary | ICD-10-CM | POA: Diagnosis not present

## 2023-01-24 DIAGNOSIS — C4321 Malignant melanoma of right ear and external auricular canal: Secondary | ICD-10-CM | POA: Diagnosis not present

## 2023-01-24 DIAGNOSIS — Z510 Encounter for antineoplastic radiation therapy: Secondary | ICD-10-CM | POA: Diagnosis not present

## 2023-01-27 DIAGNOSIS — Z510 Encounter for antineoplastic radiation therapy: Secondary | ICD-10-CM | POA: Diagnosis not present

## 2023-01-27 DIAGNOSIS — C4321 Malignant melanoma of right ear and external auricular canal: Secondary | ICD-10-CM | POA: Diagnosis not present

## 2023-01-27 DIAGNOSIS — C61 Malignant neoplasm of prostate: Secondary | ICD-10-CM | POA: Diagnosis not present

## 2023-01-28 DIAGNOSIS — C4321 Malignant melanoma of right ear and external auricular canal: Secondary | ICD-10-CM | POA: Diagnosis not present

## 2023-01-28 DIAGNOSIS — Z510 Encounter for antineoplastic radiation therapy: Secondary | ICD-10-CM | POA: Diagnosis not present

## 2023-01-28 DIAGNOSIS — C61 Malignant neoplasm of prostate: Secondary | ICD-10-CM | POA: Diagnosis not present

## 2023-01-29 DIAGNOSIS — C61 Malignant neoplasm of prostate: Secondary | ICD-10-CM | POA: Diagnosis not present

## 2023-01-29 DIAGNOSIS — Z510 Encounter for antineoplastic radiation therapy: Secondary | ICD-10-CM | POA: Diagnosis not present

## 2023-01-30 DIAGNOSIS — C61 Malignant neoplasm of prostate: Secondary | ICD-10-CM | POA: Diagnosis not present

## 2023-01-30 DIAGNOSIS — Z510 Encounter for antineoplastic radiation therapy: Secondary | ICD-10-CM | POA: Diagnosis not present

## 2023-01-31 DIAGNOSIS — Z510 Encounter for antineoplastic radiation therapy: Secondary | ICD-10-CM | POA: Diagnosis not present

## 2023-01-31 DIAGNOSIS — C61 Malignant neoplasm of prostate: Secondary | ICD-10-CM | POA: Diagnosis not present

## 2023-02-03 DIAGNOSIS — C61 Malignant neoplasm of prostate: Secondary | ICD-10-CM | POA: Diagnosis not present

## 2023-02-03 DIAGNOSIS — Z510 Encounter for antineoplastic radiation therapy: Secondary | ICD-10-CM | POA: Diagnosis not present

## 2023-02-04 DIAGNOSIS — C61 Malignant neoplasm of prostate: Secondary | ICD-10-CM | POA: Diagnosis not present

## 2023-02-04 DIAGNOSIS — Z510 Encounter for antineoplastic radiation therapy: Secondary | ICD-10-CM | POA: Diagnosis not present

## 2023-02-05 DIAGNOSIS — C61 Malignant neoplasm of prostate: Secondary | ICD-10-CM | POA: Diagnosis not present

## 2023-02-05 DIAGNOSIS — Z510 Encounter for antineoplastic radiation therapy: Secondary | ICD-10-CM | POA: Diagnosis not present

## 2023-02-06 DIAGNOSIS — Z510 Encounter for antineoplastic radiation therapy: Secondary | ICD-10-CM | POA: Diagnosis not present

## 2023-02-06 DIAGNOSIS — C61 Malignant neoplasm of prostate: Secondary | ICD-10-CM | POA: Diagnosis not present

## 2023-02-07 DIAGNOSIS — Z510 Encounter for antineoplastic radiation therapy: Secondary | ICD-10-CM | POA: Diagnosis not present

## 2023-02-07 DIAGNOSIS — C61 Malignant neoplasm of prostate: Secondary | ICD-10-CM | POA: Diagnosis not present

## 2023-02-10 DIAGNOSIS — Z510 Encounter for antineoplastic radiation therapy: Secondary | ICD-10-CM | POA: Diagnosis not present

## 2023-02-10 DIAGNOSIS — C61 Malignant neoplasm of prostate: Secondary | ICD-10-CM | POA: Diagnosis not present

## 2023-02-11 DIAGNOSIS — Z510 Encounter for antineoplastic radiation therapy: Secondary | ICD-10-CM | POA: Diagnosis not present

## 2023-02-11 DIAGNOSIS — C61 Malignant neoplasm of prostate: Secondary | ICD-10-CM | POA: Diagnosis not present

## 2023-02-12 DIAGNOSIS — C61 Malignant neoplasm of prostate: Secondary | ICD-10-CM | POA: Diagnosis not present

## 2023-02-12 DIAGNOSIS — Z510 Encounter for antineoplastic radiation therapy: Secondary | ICD-10-CM | POA: Diagnosis not present

## 2023-02-13 DIAGNOSIS — Z510 Encounter for antineoplastic radiation therapy: Secondary | ICD-10-CM | POA: Diagnosis not present

## 2023-02-13 DIAGNOSIS — C61 Malignant neoplasm of prostate: Secondary | ICD-10-CM | POA: Diagnosis not present

## 2023-02-14 DIAGNOSIS — C61 Malignant neoplasm of prostate: Secondary | ICD-10-CM | POA: Diagnosis not present

## 2023-02-14 DIAGNOSIS — Z510 Encounter for antineoplastic radiation therapy: Secondary | ICD-10-CM | POA: Diagnosis not present

## 2023-02-17 DIAGNOSIS — C61 Malignant neoplasm of prostate: Secondary | ICD-10-CM | POA: Diagnosis not present

## 2023-02-17 DIAGNOSIS — R350 Frequency of micturition: Secondary | ICD-10-CM | POA: Diagnosis not present

## 2023-02-17 DIAGNOSIS — N401 Enlarged prostate with lower urinary tract symptoms: Secondary | ICD-10-CM | POA: Diagnosis not present

## 2023-02-17 DIAGNOSIS — Z510 Encounter for antineoplastic radiation therapy: Secondary | ICD-10-CM | POA: Diagnosis not present

## 2023-02-18 DIAGNOSIS — Z510 Encounter for antineoplastic radiation therapy: Secondary | ICD-10-CM | POA: Diagnosis not present

## 2023-02-18 DIAGNOSIS — C61 Malignant neoplasm of prostate: Secondary | ICD-10-CM | POA: Diagnosis not present

## 2023-02-19 DIAGNOSIS — C61 Malignant neoplasm of prostate: Secondary | ICD-10-CM | POA: Diagnosis not present

## 2023-02-19 DIAGNOSIS — Z510 Encounter for antineoplastic radiation therapy: Secondary | ICD-10-CM | POA: Diagnosis not present

## 2023-02-20 DIAGNOSIS — C61 Malignant neoplasm of prostate: Secondary | ICD-10-CM | POA: Diagnosis not present

## 2023-02-20 DIAGNOSIS — Z510 Encounter for antineoplastic radiation therapy: Secondary | ICD-10-CM | POA: Diagnosis not present

## 2023-02-21 DIAGNOSIS — C61 Malignant neoplasm of prostate: Secondary | ICD-10-CM | POA: Diagnosis not present

## 2023-02-21 DIAGNOSIS — Z510 Encounter for antineoplastic radiation therapy: Secondary | ICD-10-CM | POA: Diagnosis not present

## 2023-02-25 DIAGNOSIS — Z510 Encounter for antineoplastic radiation therapy: Secondary | ICD-10-CM | POA: Diagnosis not present

## 2023-02-25 DIAGNOSIS — C61 Malignant neoplasm of prostate: Secondary | ICD-10-CM | POA: Diagnosis not present

## 2023-02-26 DIAGNOSIS — C61 Malignant neoplasm of prostate: Secondary | ICD-10-CM | POA: Diagnosis not present

## 2023-02-26 DIAGNOSIS — Z510 Encounter for antineoplastic radiation therapy: Secondary | ICD-10-CM | POA: Diagnosis not present

## 2023-02-27 DIAGNOSIS — C61 Malignant neoplasm of prostate: Secondary | ICD-10-CM | POA: Diagnosis not present

## 2023-02-27 DIAGNOSIS — Z510 Encounter for antineoplastic radiation therapy: Secondary | ICD-10-CM | POA: Diagnosis not present

## 2023-02-28 DIAGNOSIS — Z510 Encounter for antineoplastic radiation therapy: Secondary | ICD-10-CM | POA: Diagnosis not present

## 2023-02-28 DIAGNOSIS — C61 Malignant neoplasm of prostate: Secondary | ICD-10-CM | POA: Diagnosis not present

## 2023-03-03 DIAGNOSIS — C61 Malignant neoplasm of prostate: Secondary | ICD-10-CM | POA: Diagnosis not present

## 2023-03-03 DIAGNOSIS — Z510 Encounter for antineoplastic radiation therapy: Secondary | ICD-10-CM | POA: Diagnosis not present

## 2023-03-04 DIAGNOSIS — Z510 Encounter for antineoplastic radiation therapy: Secondary | ICD-10-CM | POA: Diagnosis not present

## 2023-03-04 DIAGNOSIS — C61 Malignant neoplasm of prostate: Secondary | ICD-10-CM | POA: Diagnosis not present

## 2023-03-05 DIAGNOSIS — Z510 Encounter for antineoplastic radiation therapy: Secondary | ICD-10-CM | POA: Diagnosis not present

## 2023-03-05 DIAGNOSIS — C61 Malignant neoplasm of prostate: Secondary | ICD-10-CM | POA: Diagnosis not present

## 2023-03-06 DIAGNOSIS — Z510 Encounter for antineoplastic radiation therapy: Secondary | ICD-10-CM | POA: Diagnosis not present

## 2023-03-06 DIAGNOSIS — L820 Inflamed seborrheic keratosis: Secondary | ICD-10-CM | POA: Diagnosis not present

## 2023-03-06 DIAGNOSIS — D485 Neoplasm of uncertain behavior of skin: Secondary | ICD-10-CM | POA: Diagnosis not present

## 2023-03-06 DIAGNOSIS — C61 Malignant neoplasm of prostate: Secondary | ICD-10-CM | POA: Diagnosis not present

## 2023-03-07 DIAGNOSIS — C61 Malignant neoplasm of prostate: Secondary | ICD-10-CM | POA: Diagnosis not present

## 2023-03-07 DIAGNOSIS — Z510 Encounter for antineoplastic radiation therapy: Secondary | ICD-10-CM | POA: Diagnosis not present

## 2023-03-26 DIAGNOSIS — L538 Other specified erythematous conditions: Secondary | ICD-10-CM | POA: Diagnosis not present

## 2023-03-26 DIAGNOSIS — L578 Other skin changes due to chronic exposure to nonionizing radiation: Secondary | ICD-10-CM | POA: Diagnosis not present

## 2023-03-26 DIAGNOSIS — L820 Inflamed seborrheic keratosis: Secondary | ICD-10-CM | POA: Diagnosis not present

## 2023-03-26 DIAGNOSIS — L821 Other seborrheic keratosis: Secondary | ICD-10-CM | POA: Diagnosis not present

## 2023-03-26 DIAGNOSIS — L814 Other melanin hyperpigmentation: Secondary | ICD-10-CM | POA: Diagnosis not present

## 2023-03-26 DIAGNOSIS — D485 Neoplasm of uncertain behavior of skin: Secondary | ICD-10-CM | POA: Diagnosis not present

## 2023-03-26 DIAGNOSIS — L570 Actinic keratosis: Secondary | ICD-10-CM | POA: Diagnosis not present

## 2023-03-26 DIAGNOSIS — R208 Other disturbances of skin sensation: Secondary | ICD-10-CM | POA: Diagnosis not present

## 2023-04-07 DIAGNOSIS — H26491 Other secondary cataract, right eye: Secondary | ICD-10-CM | POA: Diagnosis not present

## 2023-04-14 DIAGNOSIS — H26492 Other secondary cataract, left eye: Secondary | ICD-10-CM | POA: Diagnosis not present

## 2023-05-14 DIAGNOSIS — Z1389 Encounter for screening for other disorder: Secondary | ICD-10-CM | POA: Diagnosis not present

## 2023-05-14 DIAGNOSIS — E785 Hyperlipidemia, unspecified: Secondary | ICD-10-CM | POA: Diagnosis not present

## 2023-05-14 DIAGNOSIS — Z23 Encounter for immunization: Secondary | ICD-10-CM | POA: Diagnosis not present

## 2023-05-14 DIAGNOSIS — Z0000 Encounter for general adult medical examination without abnormal findings: Secondary | ICD-10-CM | POA: Diagnosis not present

## 2023-05-14 DIAGNOSIS — Z6827 Body mass index (BMI) 27.0-27.9, adult: Secondary | ICD-10-CM | POA: Diagnosis not present

## 2023-05-14 DIAGNOSIS — Z8546 Personal history of malignant neoplasm of prostate: Secondary | ICD-10-CM | POA: Diagnosis not present

## 2023-05-23 DIAGNOSIS — R310 Gross hematuria: Secondary | ICD-10-CM | POA: Diagnosis not present

## 2023-05-23 DIAGNOSIS — C61 Malignant neoplasm of prostate: Secondary | ICD-10-CM | POA: Diagnosis not present

## 2023-05-27 DIAGNOSIS — G8929 Other chronic pain: Secondary | ICD-10-CM | POA: Diagnosis not present

## 2023-05-27 DIAGNOSIS — M545 Low back pain, unspecified: Secondary | ICD-10-CM | POA: Diagnosis not present

## 2023-07-10 DIAGNOSIS — C61 Malignant neoplasm of prostate: Secondary | ICD-10-CM | POA: Diagnosis not present

## 2023-09-05 DIAGNOSIS — E7800 Pure hypercholesterolemia, unspecified: Secondary | ICD-10-CM | POA: Diagnosis not present

## 2023-09-05 DIAGNOSIS — Z79899 Other long term (current) drug therapy: Secondary | ICD-10-CM | POA: Diagnosis not present

## 2023-09-05 DIAGNOSIS — S0990XA Unspecified injury of head, initial encounter: Secondary | ICD-10-CM | POA: Diagnosis not present

## 2023-09-05 DIAGNOSIS — Z8546 Personal history of malignant neoplasm of prostate: Secondary | ICD-10-CM | POA: Diagnosis not present

## 2023-09-05 DIAGNOSIS — S0181XA Laceration without foreign body of other part of head, initial encounter: Secondary | ICD-10-CM | POA: Diagnosis not present

## 2023-09-05 DIAGNOSIS — W010XXA Fall on same level from slipping, tripping and stumbling without subsequent striking against object, initial encounter: Secondary | ICD-10-CM | POA: Diagnosis not present

## 2023-09-05 DIAGNOSIS — Z7982 Long term (current) use of aspirin: Secondary | ICD-10-CM | POA: Diagnosis not present

## 2023-09-05 DIAGNOSIS — S0083XA Contusion of other part of head, initial encounter: Secondary | ICD-10-CM | POA: Diagnosis not present

## 2023-09-05 DIAGNOSIS — Z23 Encounter for immunization: Secondary | ICD-10-CM | POA: Diagnosis not present

## 2023-09-05 DIAGNOSIS — S01111A Laceration without foreign body of right eyelid and periocular area, initial encounter: Secondary | ICD-10-CM | POA: Diagnosis not present

## 2023-09-15 DIAGNOSIS — C61 Malignant neoplasm of prostate: Secondary | ICD-10-CM | POA: Diagnosis not present
